# Patient Record
Sex: Female | Born: 1940
Health system: Southern US, Community
[De-identification: ages and names within clinical notes are randomized; demographics above are authoritative.]

## PROBLEM LIST (undated history)

## (undated) DIAGNOSIS — I10 Essential (primary) hypertension: Secondary | ICD-10-CM

## (undated) DIAGNOSIS — E785 Hyperlipidemia, unspecified: Secondary | ICD-10-CM

## (undated) DIAGNOSIS — F32A Depression, unspecified: Secondary | ICD-10-CM

## (undated) DIAGNOSIS — M8580 Other specified disorders of bone density and structure, unspecified site: Secondary | ICD-10-CM

## (undated) DIAGNOSIS — Z4509 Encounter for adjustment and management of other cardiac device: Secondary | ICD-10-CM

## (undated) DIAGNOSIS — D649 Anemia, unspecified: Secondary | ICD-10-CM

## (undated) DIAGNOSIS — R55 Syncope and collapse: Secondary | ICD-10-CM

## (undated) DIAGNOSIS — K219 Gastro-esophageal reflux disease without esophagitis: Secondary | ICD-10-CM

## (undated) HISTORY — DX: Anemia, unspecified: D64.9

## (undated) HISTORY — DX: Depression, unspecified: F32.A

## (undated) HISTORY — DX: Hyperlipidemia, unspecified: E78.5

## (undated) HISTORY — DX: Other specified disorders of bone density and structure, unspecified site: M85.80

## (undated) HISTORY — DX: Gastro-esophageal reflux disease without esophagitis: K21.9

## (undated) HISTORY — DX: Essential (primary) hypertension: I10

---
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
## 1998-05-07 HISTORY — PX: APPENDECTOMY: SHX54

## 2000-05-07 HISTORY — PX: CERVICAL LAMINOPLASTY: SHX1333

## 2011-05-08 HISTORY — PX: CHOLECYSTECTOMY: SHX55

## 2018-11-10 ENCOUNTER — Ambulatory Visit (INDEPENDENT_AMBULATORY_CARE_PROVIDER_SITE_OTHER): Payer: Medicare Other | Admitting: Internal Medicine

## 2018-11-10 ENCOUNTER — Encounter: Payer: Self-pay | Admitting: Internal Medicine

## 2018-11-10 ENCOUNTER — Other Ambulatory Visit: Payer: Self-pay

## 2018-11-10 VITALS — Wt 124.0 lb

## 2018-11-10 DIAGNOSIS — R509 Fever, unspecified: Secondary | ICD-10-CM

## 2018-11-10 NOTE — Progress Notes (Signed)
  RFV: intermittent fevers  Patient ID: Mary Leblanc, female   DOB: Jun 06, 1940, 78 y.o.   MRN: [id number]  HPI Khushboo is a 78yo F who reports flu like illness in April and also reports other household member being sick. At that time did not get tested for covid initially, but did stay home for presumed covid. She states that she did recall recovering however since then had intermittent fevers, malaise with occasional cough. She reports that she last had fever/cough in June 15th. She was referred to [facility] clinic for evaluation. She has FUO work up through [employer], but no significant answers found by PCP. She was referred to ID to see if any further work up, and whether if this recurrent infection.  Outpatient Encounter Medications as of 11/10/2018  Medication Sig  . alendronate (FOSAMAX) 70 MG tablet Take 70 mg by mouth once a week. Take with a full glass of water on an empty stomach.  Marland Kitchen aspirin EC 81 MG tablet Take 81 mg by mouth daily.  . B Complex-C (B-COMPLEX WITH VITAMIN C) tablet Take 1 tablet by mouth daily.  . Calcium Polycarbophil (FIBER-CAPS PO) Take by mouth.  . calcium-vitamin D (OSCAL WITH D) 500-200 MG-UNIT tablet Take 1 tablet by mouth.  . gabapentin (NEURONTIN) 300 MG capsule Take 300 mg by mouth 3 (three) times daily.  Marland Kitchen loperamide (IMODIUM) 2 MG capsule Take by mouth as needed for diarrhea or loose stools.  . simvastatin (ZOCOR) 10 MG tablet Take 10 mg by mouth daily.   No facility-administered encounter medications on file as of 11/10/2018.      There are no active problems to display for this patient.    Health Maintenance Due  Topic Date Due  . TETANUS/TDAP  07/23/1960  . DEXA SCAN  07/24/2006  . PNA vac Low Risk Adult (1 of 2 - PCV13) 07/24/2006     Review of Systems 12 point ros is negative currently, but other positives noted in hpi. Physical Exam   Wt 124 lb (56.2 kg) . Physical Exam  Constitutional:  oriented to person, place, and time. appears well-developed  and well-nourished. No distress.  HENT: [place]/AT, PERRLA, no scleral icterus Mouth/Throat: Oropharynx is clear and moist. No oropharyngeal exudate.  Cardiovascular: Normal rate, regular rhythm and normal heart sounds. Exam reveals no gallop and no friction rub.  No murmur heard.  Pulmonary/Chest: Effort normal and breath sounds normal. No respiratory distress.  has no wheezes.  Neck = supple, no nuchal rigidity Abdominal: Soft. Bowel sounds are normal.  exhibits no distension. There is no tenderness.  Lymphadenopathy: no cervical adenopathy. No axillary adenopathy Neurological: alert and oriented to person, place, and time.  Skin: Skin is warm and dry. No rash noted. No erythema.  Psychiatric: a normal mood and affect.  behavior is normal.   Notes /tests reviewed  Assessment and Plan  FUO = will ask her to collect fever curve. Consider getting covid 19 serology. Though literature is limited in terms of understanding reinfection of covid 42 or recrudescence which has somewhat been mentioned. She appears clinically stable currently. Will aks her to come back in a few weeks with her fever curve. No need for testing at this time  Spent 30 min with patient in counseling of fuo

## 2018-12-04 ENCOUNTER — Telehealth: Payer: Self-pay | Admitting: Internal Medicine

## 2018-12-04 NOTE — Telephone Encounter (Signed)
COVID-19 Pre-Screening Questions:12/04/18 ° ° °Do you currently have a fever (>100 °F), chills or unexplained body aches? NO  ° °Are you currently experiencing new cough, shortness of breath, sore throat, runny nose? NO °•  °Have you recently travelled outside the state of [place] in the last 14 days? NO °•  °Have you been in contact with someone that is currently pending confirmation of Covid19 testing or has been confirmed to have the Covid19 virus?  NO ° °**If the patient answers NO to ALL questions -  advise the patient to please call the clinic before coming to the office should any symptoms develop.  ° ° ° °

## 2018-12-08 ENCOUNTER — Other Ambulatory Visit: Payer: Self-pay

## 2018-12-08 ENCOUNTER — Ambulatory Visit (INDEPENDENT_AMBULATORY_CARE_PROVIDER_SITE_OTHER): Payer: Medicare Other | Admitting: Internal Medicine

## 2018-12-08 ENCOUNTER — Encounter: Payer: Self-pay | Admitting: Internal Medicine

## 2018-12-08 VITALS — BP 128/70 | HR 64 | Temp 99.0°F

## 2018-12-08 DIAGNOSIS — R509 Fever, unspecified: Secondary | ICD-10-CM

## 2018-12-08 NOTE — Progress Notes (Signed)
Patient ID: Mary Leblanc, female   DOB: [birth year], 78 y.o.   MRN: [id number]  HPI Mary Leblanc is a 78yo F briefly, she had covid like illness in mid late march recovered but then would have intermittent fevers in April, may, June wit associated fatigue.serology showed IgA + covid 19  In the last 4 wk no fevers  Has some dysphagia noted with pills and solid foods.  Pmhx: hx of thyroid nodule. Had colonoscopy at [facility] in [place] in 2019, next due in 2024  Family hx: thyroid cancer  Outpatient Encounter Medications as of 12/08/2018  Medication Sig  . alendronate (FOSAMAX) 70 MG tablet Take 70 mg by mouth once a week. Take with a full glass of water on an empty stomach.  Marland Kitchen. aspirin EC 81 MG tablet Take 81 mg by mouth daily.  . B Complex-C (B-COMPLEX WITH VITAMIN C) tablet Take 1 tablet by mouth daily.  . Calcium Polycarbophil (FIBER-CAPS PO) Take by mouth.  . calcium-vitamin D (OSCAL WITH D) 500-200 MG-UNIT tablet Take 1 tablet by mouth.  . gabapentin (NEURONTIN) 300 MG capsule Take 300 mg by mouth 3 (three) times daily.  Marland Kitchen. loperamide (IMODIUM) 2 MG capsule Take by mouth as needed for diarrhea or loose stools.  . simvastatin (ZOCOR) 10 MG tablet Take 10 mg by mouth daily.   No facility-administered encounter medications on file as of 12/08/2018.      There are no active problems to display for this patient.    Health Maintenance Due  Topic Date Due  . TETANUS/TDAP  07/23/1960  . DEXA SCAN  07/24/2006  . PNA vac Low Risk Adult (1 of 2 - PCV13) 07/24/2006  . INFLUENZA VACCINE  12/06/2018     Review of Systems Review of Systems  Constitutional: Negative for fever, chills, diaphoresis, activity change, appetite change, fatigue and unexpected weight change.  HENT: Negative for congestion, sore throat, rhinorrhea, sneezing, trouble swallowing and sinus pressure.  Eyes: Negative for photophobia and visual disturbance.  Respiratory: Negative for cough, chest tightness, shortness  of breath, wheezing and stridor.  Cardiovascular: Negative for chest pain, palpitations and leg swelling.  Gastrointestinal: +dysphagia. Negative for nausea, vomiting, abdominal pain, diarrhea, constipation, blood in stool, abdominal distention and anal bleeding.  Genitourinary: Negative for dysuria, hematuria, flank pain and difficulty urinating.  Musculoskeletal: Negative for myalgias, back pain, joint swelling, arthralgias and gait problem.  Skin: Negative for color change, pallor, rash and wound.  Neurological: Negative for dizziness, tremors, weakness and light-headedness.  Hematological: Negative for adenopathy. Does not bruise/bleed easily.  Psychiatric/Behavioral: Negative for behavioral problems, confusion, sleep disturbance, dysphoric mood, decreased concentration and agitation.    Physical Exam   BP 128/70   Pulse 64   Temp 99 F (37.2 C) (Oral)    Physical Exam  Constitutional:  oriented to person, place, and time. appears well-developed and well-nourished. No distress.  HENT: [place]/AT, PERRLA, no scleral icterus Mouth/Throat: Oropharynx is clear and moist. No oropharyngeal exudate. No thyromegaly Pulmonary/Chest: Effort normal and breath sounds normal. No respiratory distress.  has no wheezes.  Neck = supple, no nuchal rigidity Neurological: alert and oriented to person, place, and time.  Skin: Skin is warm and dry. No rash noted. No erythema.  Psychiatric: a normal mood and affect.  behavior is normal.     Assessment and Plan Recommend follow up for dysphagia, possibly start with barium swallow then decide if need to see gi.   Thyroid exam no palpable lesions.  Will cc dr  tysovic

## 2019-01-13 ENCOUNTER — Other Ambulatory Visit: Payer: Self-pay | Admitting: Internal Medicine

## 2019-01-13 DIAGNOSIS — Z1231 Encounter for screening mammogram for malignant neoplasm of breast: Secondary | ICD-10-CM

## 2019-01-22 ENCOUNTER — Encounter: Payer: Self-pay | Admitting: Cardiology

## 2019-01-22 ENCOUNTER — Ambulatory Visit (INDEPENDENT_AMBULATORY_CARE_PROVIDER_SITE_OTHER): Payer: Medicare Other | Admitting: Cardiology

## 2019-01-22 ENCOUNTER — Other Ambulatory Visit: Payer: Self-pay

## 2019-01-22 VITALS — BP 147/66 | HR 67 | Temp 97.8°F | Ht 60.0 in | Wt 124.0 lb

## 2019-01-22 DIAGNOSIS — R55 Syncope and collapse: Secondary | ICD-10-CM | POA: Insufficient documentation

## 2019-01-22 DIAGNOSIS — Z95818 Presence of other cardiac implants and grafts: Secondary | ICD-10-CM | POA: Diagnosis not present

## 2019-01-22 DIAGNOSIS — R079 Chest pain, unspecified: Secondary | ICD-10-CM

## 2019-01-22 HISTORY — DX: Syncope and collapse: R55

## 2019-01-22 NOTE — Progress Notes (Signed)
Patient referred by Gaspar Garbeisovec, Richard W, MD for syncope  Subjective:   Mary Leblanc, female    DOB: Dec 02, 1940, 78 y.o.   MRN: [id number]   Chief Complaint  Patient presents with  . Loss of Consciousness  . New Patient (Initial Visit)    HPI  78 y.o. Caucasian female with syncope   Patient is active and walks 6 miles a day 5 days a week without difficulty.  However, on 01/07/2019 at around 1:30 PM, patient had an episode of midsternal nonradiating chest pressure accompanied with shortness of breath while walking up the hill, along with sensation of "going out".  She lost consciousness and woke up to go passerby who stopped to help her.  EMS were called.  Her blood pressure was 60s over 30s, improved with IV fluids.  EKG in blood sugar were checked by EMS.  I personally reviewed the EKG performed by EMS, which showed sinus rhythm and prolonged QT interval at 499 ms.  Of note, this EKG was performed after patient regained consciousness and was no longer having chest pressure.  There were no ST segment changes on this EKG.  Patient was offered transfer to ED, but patient felt better and did not go to the ED. Since the episode, patient is back to walking up to 6 miles, although on flat ground without hills.   Patient has had several syncopal episodes in the past, usually at rest, associated with nausea, diaphoresis.  Her baseline EKG has had prolonged QT interval at times.  She has history of sudden death in her paternal grandfather at age 77.  Probably as a result of this, she underwent loop recorder [facility] [place] while she was still in [place].  I personally reviewed the loop recorder monitoring today, which does not show any arrhythmias.    Patient is a retired Animal nutritionistN and budget analyst.  She has also worked in [place] status [place].  She lives with her husband in [place] for several years.  [place] in January 2020.  Past Medical History:   Diagnosis Date  . Hyperlipidemia      Past Surgical History:  Procedure Laterality Date  . APPENDECTOMY  2000  . CHOLECYSTECTOMY  2013  . NECK SURGERY  2002     Social History   Socioeconomic History  . Marital status: Married    Spouse name: Not on file  . Number of children: Not on file  . Years of education: Not on file  . Highest education level: Not on file  Occupational History  . Not on file  Social Needs  . Financial resource strain: Not on file  . Food insecurity    Worry: Not on file    Inability: Not on file  . Transportation needs    Medical: Not on file    Non-medical: Not on file  Tobacco Use  . Smoking status: Never Smoker  . Smokeless tobacco: Never Used  Substance and Sexual Activity  . Alcohol use: Yes    Comment: occ  . Drug use: Not Currently  . Sexual activity: Not on file  Lifestyle  . Physical activity    Days per week: Not on file    Minutes per session: Not on file  . Stress: Not on file  Relationships  . Social Musicianconnections    Talks on phone: Not on file    Gets together: Not on file    Attends religious service:  Not on file    Active member of club or organization: Not on file    Attends meetings of clubs or organizations: Not on file    Relationship status: Not on file  . Intimate partner violence    Fear of current or ex partner: Not on file    Emotionally abused: Not on file    Physically abused: Not on file    Forced sexual activity: Not on file  Other Topics Concern  . Not on file  Social History Narrative  . Not on file     Family History  Problem Relation Age of Onset  . Hypertension Mother   . Stroke Mother   . Heart attack Father   . Diabetes Brother      Current Outpatient Medications on File Prior to Visit  Medication Sig Dispense Refill  . alendronate (FOSAMAX) 70 MG tablet Take 70 mg by mouth once a week. Take with a full glass of water on an empty stomach.    Marland Kitchen aspirin EC 81 MG tablet Take 81 mg by  mouth daily.    . B Complex-C (B-COMPLEX WITH VITAMIN C) tablet Take 1 tablet by mouth daily.    . Calcium Polycarbophil (FIBER-CAPS PO) Take by mouth.    . calcium-vitamin D (OSCAL WITH D) 500-200 MG-UNIT tablet Take 1 tablet by mouth.    . gabapentin (NEURONTIN) 300 MG capsule Take 300 mg by mouth 3 (three) times daily.    Marland Kitchen loperamide (IMODIUM) 2 MG capsule Take by mouth as needed for diarrhea or loose stools.    . simvastatin (ZOCOR) 10 MG tablet Take 10 mg by mouth daily.     No current facility-administered medications on file prior to visit.     Cardiovascular studies:  EKG 01/22/2019: Sinus rhythm 63 bpm. QTc 463 msec Normal EKG.  EKG 01/13/2019: Sinus rhythm 50 bpm.  Normal EKG.  Recent labs: Not available   Review of Systems  Constitution: Negative for decreased appetite, malaise/fatigue, weight gain and weight loss.  HENT: Negative for congestion.   Eyes: Negative for visual disturbance.  Cardiovascular: Positive for chest pain, dyspnea on exertion and syncope. Negative for leg swelling and palpitations.  Respiratory: Positive for shortness of breath. Negative for cough.   Endocrine: Negative for cold intolerance.  Hematologic/Lymphatic: Does not bruise/bleed easily.  Skin: Negative for itching and rash.  Musculoskeletal: Negative for myalgias.  Gastrointestinal: Negative for abdominal pain, nausea and vomiting.  Genitourinary: Negative for dysuria.  Neurological: Negative for dizziness and weakness.  Psychiatric/Behavioral: The patient is not nervous/anxious.   All other systems reviewed and are negative.       Vitals:   01/22/19 1452  BP: (!) 147/66  Pulse: 67  Temp: 97.8 F (36.6 C)  SpO2: 99%     Body mass index is 24.22 kg/m. Filed Weights   01/22/19 1452  Weight: 124 lb (56.2 kg)     Objective:   Physical Exam  Constitutional: She is oriented to person, place, and time. She appears well-developed and well-nourished. No distress.  HENT:   Head: Normocephalic and atraumatic.  Eyes: Pupils are equal, round, and reactive to light. Conjunctivae are normal.  Neck: No JVD present.  Cardiovascular: Normal rate, regular rhythm and intact distal pulses.  No murmur heard. Pulmonary/Chest: Effort normal and breath sounds normal. She has no wheezes. She has no rales.  Abdominal: Soft. Bowel sounds are normal. There is no rebound.  Musculoskeletal:        General: No  edema.  Lymphadenopathy:    She has no cervical adenopathy.  Neurological: She is alert and oriented to person, place, and time. No cranial nerve deficit.  Skin: Skin is warm and dry.  Psychiatric: She has a normal mood and affect.  Nursing note and vitals reviewed.         Assessment & Recommendations:   78 y.o. Caucasian female with exertional chest pressure and syncope, family history of CAD.    Exertional chest pressure and syncope: While many of her syncopal episodes do appear vasovagal in etiology, most recent episode on 01/07/2019 occurred while climbing up the hill, and associated with exertional chest pressure and dyspnea.  Will obtain exercise nuclear stress test to evaluate for obstructive CAD.  Will obtain reports of echocardiogram performed at [facility] in 2019.  Continue aspirin and simvastatin at this time.  Personally reviewed remote monitoring check of [employer] loop recorder.  No arrhythmias noted.  Elevated blood pressure without diagnosis of hypertension: Blood pressure usually controlled.  Elevated today.  Recommend regular home monitoring and repeat office check.   Thank you for referring the patient to [place]. Please feel free to contact with any questions.  Nigel Mormon, MD [facility] Pager: [phone] Office: [phone] If no answer Cell [phone]

## 2019-02-11 DIAGNOSIS — Z95818 Presence of other cardiac implants and grafts: Secondary | ICD-10-CM | POA: Diagnosis not present

## 2019-02-11 DIAGNOSIS — R55 Syncope and collapse: Secondary | ICD-10-CM | POA: Diagnosis not present

## 2019-02-11 DIAGNOSIS — Z4509 Encounter for adjustment and management of other cardiac device: Secondary | ICD-10-CM | POA: Diagnosis not present

## 2019-02-14 ENCOUNTER — Encounter: Payer: Self-pay | Admitting: Cardiology

## 2019-02-14 DIAGNOSIS — Z4509 Encounter for adjustment and management of other cardiac device: Secondary | ICD-10-CM

## 2019-02-14 DIAGNOSIS — Z9889 Other specified postprocedural states: Secondary | ICD-10-CM | POA: Insufficient documentation

## 2019-02-14 HISTORY — DX: Encounter for adjustment and management of other cardiac device: Z45.09

## 2019-02-16 ENCOUNTER — Other Ambulatory Visit: Payer: Medicare Other

## 2019-02-23 ENCOUNTER — Telehealth: Payer: Self-pay

## 2019-02-23 NOTE — Telephone Encounter (Signed)
-----   Message from Adrian Prows, MD sent at 02/15/2019  5:15 PM EDT ----- Regarding: Loop recorder No A. Fib no heart block or slow HR. Continue remote monitoring

## 2019-02-23 NOTE — Telephone Encounter (Signed)
Left vm with details

## 2019-02-25 ENCOUNTER — Ambulatory Visit: Payer: Medicare Other | Admitting: Cardiology

## 2019-03-09 ENCOUNTER — Other Ambulatory Visit: Payer: Self-pay

## 2019-03-09 ENCOUNTER — Ambulatory Visit (INDEPENDENT_AMBULATORY_CARE_PROVIDER_SITE_OTHER): Payer: Medicare Other

## 2019-03-09 DIAGNOSIS — R55 Syncope and collapse: Secondary | ICD-10-CM

## 2019-03-09 DIAGNOSIS — R079 Chest pain, unspecified: Secondary | ICD-10-CM

## 2019-03-11 NOTE — Progress Notes (Signed)
Gave results to patient. Patient verbalized understanding.

## 2019-03-11 NOTE — Progress Notes (Signed)
Called pt no answer, left a vm

## 2019-03-17 ENCOUNTER — Other Ambulatory Visit: Payer: Self-pay | Admitting: Internal Medicine

## 2019-03-17 DIAGNOSIS — E041 Nontoxic single thyroid nodule: Secondary | ICD-10-CM

## 2019-03-18 ENCOUNTER — Ambulatory Visit: Payer: Medicare Other | Admitting: Cardiology

## 2019-03-23 ENCOUNTER — Ambulatory Visit
Admission: RE | Admit: 2019-03-23 | Discharge: 2019-03-23 | Disposition: A | Discharge status: Home / Self Care | Payer: Medicare Other | Source: Ambulatory Visit | Attending: Internal Medicine | Admitting: Internal Medicine

## 2019-03-23 DIAGNOSIS — E041 Nontoxic single thyroid nodule: Secondary | ICD-10-CM

## 2019-03-24 ENCOUNTER — Telehealth: Payer: Self-pay

## 2019-03-24 NOTE — Telephone Encounter (Signed)
Pt aware.

## 2019-03-24 NOTE — Telephone Encounter (Signed)
-----   Message from Adrian Prows, MD sent at 03/19/2019 11:16 PM EST ----- Regarding: Loop Scheduled Remote loop recorder check 03/13/2019:  Normal sinus rhythm.  No significant abnormalities. No heart block.  Continue remote monitoring.

## 2019-03-25 ENCOUNTER — Telehealth (INDEPENDENT_AMBULATORY_CARE_PROVIDER_SITE_OTHER): Payer: Medicare Other | Admitting: Cardiology

## 2019-03-25 ENCOUNTER — Other Ambulatory Visit: Payer: Self-pay

## 2019-03-25 VITALS — BP 136/73 | HR 71

## 2019-03-25 DIAGNOSIS — R55 Syncope and collapse: Secondary | ICD-10-CM

## 2019-03-25 DIAGNOSIS — R079 Chest pain, unspecified: Secondary | ICD-10-CM | POA: Diagnosis not present

## 2019-03-25 DIAGNOSIS — E782 Mixed hyperlipidemia: Secondary | ICD-10-CM

## 2019-03-25 NOTE — Progress Notes (Addendum)
Patient referred by Haywood Pao, MD for syncope  Subjective:   Mary Leblanc, female    DOB: 06-06-1940, 78 y.o.   MRN: [id number]   Chief Complaint  Patient presents with  . Loss of Consciousness    HPI  78 y.o. Caucasian female with exertional chest pressure and syncope, family history of CAD.    Patient has not had any episode of syncope since 01/2019. She is back to walking 6 miles, without any denies chest pain, shortness of breath, palpitations, leg edema, orthopnea, PND, TIA/syncope. She is wearing a loop recorder, which has not showed any arrhthymias to correlate with her syncope episodes. She recalls that she had had similar episodes of syncope in her younger yeats.   Stress test results discussed with the patient, details below.   Initial consult note HPI 01/22/2019: Patient is active and walks 6 miles a day 5 days a week without difficulty.  However, on 01/07/2019 at around 1:30 PM, patient had an episode of midsternal nonradiating chest pressure accompanied with shortness of breath while walking up the hill, along with sensation of "going out".  She lost consciousness and woke up to go passerby who stopped to help her.  EMS were called.  Her blood pressure was 60s over 30s, improved with IV fluids.  EKG in blood sugar were checked by EMS.  I personally reviewed the EKG performed by EMS, which showed sinus rhythm and prolonged QT interval at 499 ms.  Of note, this EKG was performed after patient regained consciousness and was no longer having chest pressure.  There were no ST segment changes on this EKG.  Patient was offered transfer to ED, but patient felt better and did not go to the ED. Since the episode, patient is back to walking up to 6 miles, although on flat ground without hills.   Patient has had several syncopal episodes in the past, usually at rest, associated with nausea, diaphoresis.  Her baseline EKG has had prolonged QT interval at times.  She has history of  sudden death in her paternal grandfather at age 38.  Probably as a result of this, she underwent loop recorder placement at [facility] clinic in [place] while she was still in [place].  I personally reviewed the loop recorder monitoring today, which does not show any arrhythmias.    Patient is a retired Dealer.  She has also worked in [place] status family.  She lives with her husband in [place] for several years.  They moved from [place] to [place] in January 2020.  Past Medical History:  Diagnosis Date  . Encounter for loop recorder check 02/14/2019  . Hyperlipidemia   . Syncope and collapse 01/22/2019     Past Surgical History:  Procedure Laterality Date  . APPENDECTOMY  2000  . CHOLECYSTECTOMY  2013  . NECK SURGERY  2002     Social History   Socioeconomic History  . Marital status: Married    Spouse name: Not on file  . Number of children: Not on file  . Years of education: Not on file  . Highest education level: Not on file  Occupational History  . Not on file  Social Needs  . Financial resource strain: Not on file  . Food insecurity    Worry: Not on file    Inability: Not on file  . Transportation needs    Medical: Not on file    Non-medical: Not on file  Tobacco Use  .  Smoking status: Former Smoker    Packs/day: 1.00    Years: 10.00    Pack years: 10.00    Types: Cigarettes    Quit date: 1968    Years since quitting: 52.9  . Smokeless tobacco: Never Used  Substance and Sexual Activity  . Alcohol use: Yes    Comment: occ  . Drug use: Not Currently  . Sexual activity: Not on file  Lifestyle  . Physical activity    Days per week: Not on file    Minutes per session: Not on file  . Stress: Not on file  Relationships  . Social Musician on phone: Not on file    Gets together: Not on file    Attends religious service: Not on file    Active member of club or organization: Not on file    Attends meetings of clubs or  organizations: Not on file    Relationship status: Not on file  . Intimate partner violence    Fear of current or ex partner: Not on file    Emotionally abused: Not on file    Physically abused: Not on file    Forced sexual activity: Not on file  Other Topics Concern  . Not on file  Social History Narrative  . Not on file     Family History  Problem Relation Age of Onset  . Hypertension Mother   . Stroke Mother   . Heart attack Father   . Diabetes Brother      Current Outpatient Medications on File Prior to Visit  Medication Sig Dispense Refill  . alendronate (FOSAMAX) 70 MG tablet Take 70 mg by mouth once a week. Take with a full glass of water on an empty stomach.    Marland Kitchen aspirin EC 81 MG tablet Take 81 mg by mouth daily.    . B Complex-C (B-COMPLEX WITH VITAMIN C) tablet Take 1 tablet by mouth daily.    . Calcium Polycarbophil (FIBER-CAPS PO) Take by mouth.    . calcium-vitamin D (OSCAL WITH D) 500-200 MG-UNIT tablet Take 1 tablet by mouth.    . gabapentin (NEURONTIN) 300 MG capsule Take 600 mg by mouth 3 (three) times daily.     Marland Kitchen loperamide (IMODIUM) 2 MG capsule Take by mouth as needed for diarrhea or loose stools.    . simvastatin (ZOCOR) 10 MG tablet Take 10 mg by mouth daily.     No current facility-administered medications on file prior to visit.     Cardiovascular studies:  Exercise tetrofosmin stress test  03/09/2019: Normal ECG stress. exercised for a total of 6 minutes and 0 seconds, achieving approximately 7.05 METs. Baseline heart rate was measured at 62 bpm. A maximum heart rate of 128 beats per minute was achieved, which is 90% of maximum predicted heart rate.  During exercise the patient developed  dyspnea, dizziness, & chest tightness 5/10. Exercise was terminated due to target heart rate achieved.  The calculated Duke Treadmill Score is 2.00 (Moderate risk) Normal myocardial perfusion. Stress LV EF: 62%. Low risk study.  No previous exam available for  comparison.  EKG 01/22/2019: Sinus rhythm 63 bpm. QTc 463 msec Normal EKG.  EKG 01/13/2019: Sinus rhythm 50 bpm.  Normal EKG.  Recent labs: Not available   Review of Systems  Constitution: Negative for decreased appetite, malaise/fatigue, weight gain and weight loss.  HENT: Negative for congestion.   Eyes: Negative for visual disturbance.  Cardiovascular: Positive for chest pain, dyspnea on  exertion and syncope. Negative for leg swelling and palpitations.  Respiratory: Positive for shortness of breath. Negative for cough.   Endocrine: Negative for cold intolerance.  Hematologic/Lymphatic: Does not bruise/bleed easily.  Skin: Negative for itching and rash.  Musculoskeletal: Negative for myalgias.  Gastrointestinal: Negative for abdominal pain, nausea and vomiting.  Genitourinary: Negative for dysuria.  Neurological: Negative for dizziness and weakness.  Psychiatric/Behavioral: The patient is not nervous/anxious.   All other systems reviewed and are negative.       Vitals:   03/25/19 1453  BP: 136/73  Pulse: 71     Objective:   Physical Exam  Constitutional: She is oriented to person, place, and time. She appears well-developed and well-nourished. No distress.  HENT:  Head: Normocephalic and atraumatic.  Eyes: Pupils are equal, round, and reactive to light. Conjunctivae are normal.  Neck: No JVD present.  Cardiovascular: Normal rate, regular rhythm and intact distal pulses.  No murmur heard. Pulmonary/Chest: Effort normal and breath sounds normal. She has no wheezes. She has no rales.  Abdominal: Soft. Bowel sounds are normal. There is no rebound.  Musculoskeletal:        General: No edema.  Lymphadenopathy:    She has no cervical adenopathy.  Neurological: She is alert and oriented to person, place, and time. No cranial nerve deficit.  Skin: Skin is warm and dry.  Psychiatric: She has a normal mood and affect.  Nursing note and vitals reviewed.          Assessment & Recommendations:   78 y.o. Caucasian female with exertional chest pressure and syncope, family history of CAD.    Exertional chest pressure and syncope: No ischemia on stress test or EKG, although she did have chest pressure during exercise. No recurrence of symptoms. No arrhythmia on loop recorder. I did [facility] see [facility] [place]. Nonetheless, suspicion for structural abnormality is low. EF normal on stress test. Will get recent labs from PCP, including lipid panel.   With the likelihood that her syncope episode was vasovagal, recommend liberal hydration, use of compression stockings, and counter pressure maneuvers. In case of recurrent syncope, may need to consider midodrine.    I will see her on as needed basis.   Elder NegusManish J Senia Even, MD [facility] Pager: [phone] Office: [phone] If no answer Cell [phone]   Addendum: Received these labs after the visi. Cholesterol 202, triglycerides 236, HDL 57, LDL 98. Apolipoprotein B 107 (<90)  Recommend switching simvastatin 10 mg to rosuvastatin 10 mg. Repeat lipid panel in 6  Months, then OV

## 2019-03-26 ENCOUNTER — Encounter: Payer: Self-pay | Admitting: Cardiology

## 2019-03-26 MED ORDER — ROSUVASTATIN CALCIUM 10 MG PO TABS: 10.0000 mg | ORAL_TABLET | Freq: Every day | ORAL | 5 refills | Status: DC

## 2019-03-26 NOTE — Addendum Note (Signed)
Addended by: Nigel Mormon on: 03/26/2019 01:23 PM   Modules accepted: Orders

## 2019-04-14 DIAGNOSIS — Z95818 Presence of other cardiac implants and grafts: Secondary | ICD-10-CM | POA: Diagnosis not present

## 2019-04-14 DIAGNOSIS — Z4509 Encounter for adjustment and management of other cardiac device: Secondary | ICD-10-CM | POA: Diagnosis not present

## 2019-04-14 DIAGNOSIS — R55 Syncope and collapse: Secondary | ICD-10-CM | POA: Diagnosis not present

## 2019-04-15 ENCOUNTER — Ambulatory Visit (INDEPENDENT_AMBULATORY_CARE_PROVIDER_SITE_OTHER): Payer: Medicare Other | Admitting: Podiatry

## 2019-04-15 ENCOUNTER — Other Ambulatory Visit: Payer: Self-pay

## 2019-04-15 ENCOUNTER — Ambulatory Visit (INDEPENDENT_AMBULATORY_CARE_PROVIDER_SITE_OTHER): Payer: Medicare Other

## 2019-04-15 ENCOUNTER — Encounter: Payer: Self-pay | Admitting: Podiatry

## 2019-04-15 VITALS — BP 150/82

## 2019-04-15 DIAGNOSIS — M79671 Pain in right foot: Secondary | ICD-10-CM

## 2019-04-15 DIAGNOSIS — M7671 Peroneal tendinitis, right leg: Secondary | ICD-10-CM | POA: Diagnosis not present

## 2019-04-15 DIAGNOSIS — M722 Plantar fascial fibromatosis: Secondary | ICD-10-CM

## 2019-04-15 MED ORDER — METHYLPREDNISOLONE 4 MG PO TBPK: ORAL_TABLET | ORAL | 0 refills | Status: DC

## 2019-04-15 NOTE — Patient Instructions (Signed)

## 2019-04-16 ENCOUNTER — Encounter: Payer: Self-pay | Admitting: Podiatry

## 2019-04-16 NOTE — Progress Notes (Signed)
Subjective:  Patient ID: [place], female    DOB: Mar 14, 1941,  MRN: [id number]  Chief Complaint  Patient presents with  . Foot Pain    pt is here for plantar fasciitis of the right foot    78 y.o. female presents with the above complaint.  Patient presents with acute pain on the bottom of her right heel as well as some on the lateral side of her heel.  Patient states been going on for couple of months.  She states is burning sensation to the bottom of the right heel.  The pain is on and off.  Patient states that it is sometimes worse in the morning and get slightly better as she walks throughout the day but generally stays the same.  She denies any other acute complaints.  She also has secondary pain on the lateral aspect of her right foot as well.  She denies any aggravating factor except for walking.  She denies any treatment modalities.  She denies seeing any other podiatrist.  She denies any other acute complaints.   Review of Systems: Negative except as noted in the HPI. Denies N/V/F/Ch.  Past Medical History:  Diagnosis Date  . Encounter for loop recorder check 02/14/2019  . Hyperlipidemia   . Syncope and collapse 01/22/2019    Current Outpatient Medications:  .  alendronate (FOSAMAX) 70 MG tablet, Take 70 mg by mouth once a week. Take with a full glass of water on an empty stomach., Disp: , Rfl:  .  amoxicillin (AMOXIL) 500 MG capsule, TAKE 2 CAPSULES BY MOUTH NOW THEN 1 CAPSULE 4 TIMES A DAY FOR DENTAL INFECTION, Disp: , Rfl:  .  aspirin EC 81 MG tablet, Take 81 mg by mouth daily., Disp: , Rfl:  .  B Complex-C (B-COMPLEX WITH VITAMIN C) tablet, Take 1 tablet by mouth daily., Disp: , Rfl:  .  Calcium Polycarbophil (FIBER-CAPS PO), Take by mouth., Disp: , Rfl:  .  calcium-vitamin D (OSCAL WITH D) 500-200 MG-UNIT tablet, Take 1 tablet by mouth., Disp: , Rfl:  .  chlorhexidine (PERIDEX) 0.12 % solution, SMARTSIG:0.5 Ounce(s) By Mouth Twice Daily, Disp: , Rfl:  .  gabapentin  (NEURONTIN) 300 MG capsule, Take 600 mg by mouth 3 (three) times daily. , Disp: , Rfl:  .  ibuprofen (ADVIL) 800 MG tablet, Take 800 mg by mouth every 8 (eight) hours., Disp: , Rfl:  .  loperamide (IMODIUM) 2 MG capsule, Take by mouth as needed for diarrhea or loose stools., Disp: , Rfl:  .  predniSONE (STERAPRED UNI-PAK 21 TAB) 10 MG (21) TBPK tablet, TAKE 6 TABLETS ON DAY 1 AS DIRECTED ON PACKAGE AND DECREASE BY 1 TAB EACH DAY FOR A TOTAL OF 6 DAYS, Disp: , Rfl:  .  rosuvastatin (CRESTOR) 10 MG tablet, Take 1 tablet (10 mg total) by mouth daily., Disp: 30 tablet, Rfl: 5 .  methylPREDNISolone (MEDROL DOSEPAK) 4 MG TBPK tablet, Use as directed, Disp: 1 each, Rfl: 0  Social History   Tobacco Use  Smoking Status Former Smoker  . Packs/day: 1.00  . Years: 10.00  . Pack years: 10.00  . Types: Cigarettes  . Quit date: 51  . Years since quitting: 52.9  Smokeless Tobacco Never Used    No Known Allergies Objective:   Vitals:   04/15/19 0848  BP: (!) 150/82   There is no height or weight on file to calculate BMI. Constitutional Well developed. Well nourished.  Vascular Dorsalis pedis pulses palpable bilaterally. Posterior tibial  pulses palpable bilaterally. Capillary refill normal to all digits.  No cyanosis or clubbing noted. Pedal hair growth normal.  Neurologic Normal speech. Oriented to person, place, and time. Epicritic sensation to light touch grossly present bilaterally.  Dermatologic Nails well groomed and normal in appearance. No open wounds. No skin lesions.  Orthopedic: Normal joint ROM without pain or crepitus bilaterally. No visible deformities. Tender to palpation at the calcaneal tuber right. No pain with calcaneal squeeze right. Ankle ROM full range of motion right. Silfverskiold Test: negative right.   Radiographs: Taken and reviewed. No acute fractures or dislocations. No evidence of stress fracture.  Plantar heel spur present. Posterior heel spur absent.    Assessment:   1. Plantar fasciitis of right foot   2. Pain in right foot   3. Peroneal tendinitis of right lower leg    Plan:  Patient was evaluated and treated and all questions answered.  Plantar Fasciitis, right - XR reviewed as above.  - Educated on icing and stretching. Instructions given.  - Injection delivered to the plantar fascia as below. - DME: Plantar Fascial Brace - Pharmacologic management: Medrol Dose Pak. Educated on risks/benefits and proper taking of medication.  Right peroneal tendinitis -At this time I will hold off on treatment treating the peroneal tendinitis.  I believe this is due to compensation from the heel pain as patient is firing the peroneal tendinitis because she is walking on the outside of her foot.  Given the changes in the gait mechanics, once I treat the plantar fasciitis and if her pain is still present at the peroneal tendons I would treat it but I am confident that once the heel pain is treated the peroneal tendinitis will also resolve.  Procedure: Injection Tendon/Ligament Location: Left plantar fascia at the glabrous junction; medial approach. Skin Prep: alcohol Injectate: 0.5 cc 0.5% marcaine plain, 0.5 cc of 1% Lidocaine, 0.5 cc kenalog 10. Disposition: Patient tolerated procedure well. Injection site dressed with a band-aid.  No follow-ups on file.

## 2019-04-27 ENCOUNTER — Telehealth: Payer: Self-pay

## 2019-04-27 NOTE — Telephone Encounter (Signed)
-----   Message from Adrian Prows, MD sent at 04/26/2019  7:44 PM EST ----- Regarding: Loop No abnormality in rhythm. Continue to monitor

## 2019-04-27 NOTE — Telephone Encounter (Signed)
LVM with details.

## 2019-05-15 DIAGNOSIS — Z95818 Presence of other cardiac implants and grafts: Secondary | ICD-10-CM

## 2019-05-15 DIAGNOSIS — R55 Syncope and collapse: Secondary | ICD-10-CM

## 2019-05-15 DIAGNOSIS — Z4509 Encounter for adjustment and management of other cardiac device: Secondary | ICD-10-CM

## 2019-05-20 ENCOUNTER — Telehealth: Payer: Self-pay

## 2019-05-20 NOTE — Telephone Encounter (Signed)
-----   Message from Yates Decamp, MD sent at 05/16/2019  9:53 PM EST ----- Regarding: Loop recorder Scheduled Remote loop recorder check 05/13/2019:  Normal sinus rhythm.  No significant abnormalities. No heart block.  Continue remote monitoring.  Normal rhythm, no abnormality.  JG

## 2019-05-20 NOTE — Telephone Encounter (Signed)
LVM with details.

## 2019-05-25 ENCOUNTER — Other Ambulatory Visit: Payer: Self-pay

## 2019-05-25 ENCOUNTER — Encounter: Payer: Self-pay | Admitting: Podiatry

## 2019-05-25 ENCOUNTER — Ambulatory Visit (INDEPENDENT_AMBULATORY_CARE_PROVIDER_SITE_OTHER): Payer: Medicare Other | Admitting: Podiatry

## 2019-05-25 DIAGNOSIS — M7731 Calcaneal spur, right foot: Secondary | ICD-10-CM | POA: Diagnosis not present

## 2019-05-25 DIAGNOSIS — M79671 Pain in right foot: Secondary | ICD-10-CM

## 2019-05-25 DIAGNOSIS — M722 Plantar fascial fibromatosis: Secondary | ICD-10-CM

## 2019-05-25 NOTE — Progress Notes (Signed)
Subjective:  Patient ID: Mary Leblanc, female    DOB: Oct 10, 1940,  MRN: [id number]  Chief Complaint  Patient presents with  . Plantar Fasciitis    right foot is no change since last office visit    79 y.o. female presents with the above complaint.  Patient is here for follow-up of right plantar fasciitis.  Patient states that there has not been much of a change since last visit.  She states the injection helped for a little bit but the pain was still been the same.  She states that she her goal is to walk for very long time.  She states that that has not been achieved yet.  She completed a Medrol Dosepak which helped for a little bit.  She has been wearing her plantar fascial brace intermittently and not at all times when she is ambulating.  She denies any other acute complaints.  She normally ambulates with new balance sneakers.   Review of Systems: Negative except as noted in the HPI. Denies N/V/F/Ch.  Past Medical History:  Diagnosis Date  . Encounter for loop recorder check 02/14/2019  . Hyperlipidemia   . Syncope and collapse 01/22/2019    Current Outpatient Medications:  .  alendronate (FOSAMAX) 70 MG tablet, Take 70 mg by mouth once a week. Take with a full glass of water on an empty stomach., Disp: , Rfl:  .  amoxicillin (AMOXIL) 500 MG capsule, TAKE 2 CAPSULES BY MOUTH NOW THEN 1 CAPSULE 4 TIMES A DAY FOR DENTAL INFECTION, Disp: , Rfl:  .  aspirin EC 81 MG tablet, Take 81 mg by mouth daily., Disp: , Rfl:  .  B Complex-C (B-COMPLEX WITH VITAMIN C) tablet, Take 1 tablet by mouth daily., Disp: , Rfl:  .  Calcium Polycarbophil (FIBER-CAPS PO), Take by mouth., Disp: , Rfl:  .  calcium-vitamin D (OSCAL WITH D) 500-200 MG-UNIT tablet, Take 1 tablet by mouth., Disp: , Rfl:  .  chlorhexidine (PERIDEX) 0.12 % solution, SMARTSIG:0.5 Ounce(s) By Mouth Twice Daily, Disp: , Rfl:  .  gabapentin (NEURONTIN) 300 MG capsule, Take 600 mg by mouth 3 (three) times daily. , Disp: , Rfl:  .  ibuprofen  (ADVIL) 800 MG tablet, Take 800 mg by mouth every 8 (eight) hours., Disp: , Rfl:  .  loperamide (IMODIUM) 2 MG capsule, Take by mouth as needed for diarrhea or loose stools., Disp: , Rfl:  .  methylPREDNISolone (MEDROL DOSEPAK) 4 MG TBPK tablet, Use as directed, Disp: 1 each, Rfl: 0 .  predniSONE (STERAPRED UNI-PAK 21 TAB) 10 MG (21) TBPK tablet, TAKE 6 TABLETS ON DAY 1 AS DIRECTED ON PACKAGE AND DECREASE BY 1 TAB EACH DAY FOR A TOTAL OF 6 DAYS, Disp: , Rfl:  .  rosuvastatin (CRESTOR) 10 MG tablet, Take 1 tablet (10 mg total) by mouth daily., Disp: 30 tablet, Rfl: 5  Social History   Tobacco Use  Smoking Status Former Smoker  . Packs/day: 1.00  . Years: 10.00  . Pack years: 10.00  . Types: Cigarettes  . Quit date: 2  . Years since quitting: 53.0  Smokeless Tobacco Never Used    No Known Allergies Objective:   There were no vitals filed for this visit. There is no height or weight on file to calculate BMI. Constitutional Well developed. Well nourished.  Vascular Dorsalis pedis pulses palpable bilaterally. Posterior tibial pulses palpable bilaterally. Capillary refill normal to all digits.  No cyanosis or clubbing noted. Pedal hair growth normal.  Neurologic Normal speech.  Oriented to person, place, and time. Epicritic sensation to light touch grossly present bilaterally.  Dermatologic Nails well groomed and normal in appearance. No open wounds. No skin lesions.  Orthopedic: Normal joint ROM without pain or crepitus bilaterally. No visible deformities. Tender to palpation at the calcaneal tuber right. No pain with calcaneal squeeze right. Ankle ROM full range of motion right. Silfverskiold Test: negative right.   Radiographs: None  Assessment:   No diagnosis found. Plan:  Patient was evaluated and treated and all questions answered.  Plantar Fasciitis, right - XR reviewed as above.  - Re-Educated on icing and stretching. Instructions given.  - Injection  delivered to the plantar fascia as below. - DME: Night splint - Pharmacologic management: None  -I explained to the patient that if her pain does not give resolved with this injection and if the pain stays the same I will have it completely immobilize her with a cam boot.  Patient agrees with this plan and will discuss it further during next visit.  I have also asked her to wear the plantar fascial brace when she is ambulating.  Right peroneal tendinitis -At this time I will hold off on treatment treating the peroneal tendinitis.  I believe this is due to compensation from the heel pain as patient is firing the peroneal tendinitis because she is walking on the outside of her foot.  Given the changes in the gait mechanics, once I treat the plantar fasciitis and if her pain is still present at the peroneal tendons I would treat it but I am confident that once the heel pain is treated the peroneal tendinitis will also resolve.  Procedure: Injection Tendon/Ligament Location: Left plantar fascia at the glabrous junction; medial approach. Skin Prep: alcohol Injectate: 0.5 cc 0.5% marcaine plain, 0.5 cc of 1% Lidocaine, 0.5 cc kenalog 10. Disposition: Patient tolerated procedure well. Injection site dressed with a band-aid.  No follow-ups on file.

## 2019-05-31 ENCOUNTER — Ambulatory Visit: Payer: Medicare Other | Attending: Internal Medicine

## 2019-05-31 DIAGNOSIS — Z23 Encounter for immunization: Secondary | ICD-10-CM | POA: Insufficient documentation

## 2019-05-31 NOTE — Progress Notes (Signed)
   Covid-19 Vaccination Clinic  Name:  [name]    MRN: [id number] DOB: [birth year]  05/31/2019  [name] was observed post Covid-19 immunization for 15 minutes without incidence. She was provided with Vaccine Information Sheet and instruction to access the V-Safe system.   [name] was instructed to call 911 with any severe reactions post vaccine: Marland Kitchen Difficulty breathing  . Swelling of your face and throat  . A fast heartbeat  . A bad rash all over your body  . Dizziness and weakness    Immunizations Administered    Name Date Dose VIS Date Route   Pfizer COVID-19 Vaccine 05/31/2019 11:06 AM 0.3 mL 04/17/2019 Intramuscular   Manufacturer: [employer], [employer]   Lot: [id number]   NDC: [address]-1

## 2019-06-22 ENCOUNTER — Other Ambulatory Visit: Payer: Self-pay

## 2019-06-22 ENCOUNTER — Ambulatory Visit (INDEPENDENT_AMBULATORY_CARE_PROVIDER_SITE_OTHER): Payer: Medicare Other | Admitting: Podiatry

## 2019-06-22 ENCOUNTER — Ambulatory Visit: Payer: Medicare Other | Attending: Internal Medicine

## 2019-06-22 DIAGNOSIS — M722 Plantar fascial fibromatosis: Secondary | ICD-10-CM

## 2019-06-22 DIAGNOSIS — M79671 Pain in right foot: Secondary | ICD-10-CM

## 2019-06-22 DIAGNOSIS — Z23 Encounter for immunization: Secondary | ICD-10-CM | POA: Insufficient documentation

## 2019-06-22 NOTE — Progress Notes (Signed)
   Covid-19 Vaccination Clinic  Name:  Mary Leblanc    MRN: [id number] DOB: 1941-01-19  06/22/2019  Mary Leblanc was observed post Covid-19 immunization for 15 minutes without incidence. She was provided with Vaccine Information Sheet and instruction to access the V-Safe system.   Mary Leblanc was instructed to call 911 with any severe reactions post vaccine: Marland Kitchen Difficulty breathing  . Swelling of your face and throat  . A fast heartbeat  . A bad rash all over your body  . Dizziness and weakness    Immunizations Administered    Name Date Dose VIS Date Route   Pfizer COVID-19 Vaccine 06/22/2019  8:57 AM 0.3 mL 04/17/2019 Intramuscular   Manufacturer: [employer], [employer]   Lot: [id number]   NDC: [id number]

## 2019-06-23 ENCOUNTER — Encounter: Payer: Self-pay | Admitting: Podiatry

## 2019-06-23 NOTE — Progress Notes (Signed)
Subjective:  Patient ID: Mary Leblanc, female    DOB: [birth year],  MRN: [id number]  Chief Complaint  Patient presents with  . Plantar Fasciitis    pt is here for a f/u on plantar fasciitis, pt states that she is not feeling better since the last time she was here, pt is concerned that the injection and the brace has not helped, and states that pain is a 10 out of 76.    79 y.o. female presents with the above complaint.  Patient is here for follow-up of right plantar fasciitis.  She states that there is still a lot of pain associated with the right foot.  She has tried injections and has failed all injection therapy.  She has orthotics in the shoes which has also failed.  She does not know if the plantar fascial brace is helping at all.  She has been wearing her night splint as well.  She has been doing her stretching as it.  None of that has helped.  She would like to know what is the next step to help improve the pain.  She states that she also has to go out of town in 4 weeks and will return within a week and will follow up afterwards.  She denies any other acute complaints.  Review of Systems: Negative except as noted in the HPI. Denies N/V/F/Ch.  Past Medical History:  Diagnosis Date  . Encounter for loop recorder check 02/14/2019  . Hyperlipidemia   . Syncope and collapse 01/22/2019    Current Outpatient Medications:  .  alendronate (FOSAMAX) 70 MG tablet, Take 70 mg by mouth once a week. Take with a full glass of water on an empty stomach., Disp: , Rfl:  .  amoxicillin (AMOXIL) 500 MG capsule, TAKE 2 CAPSULES BY MOUTH NOW THEN 1 CAPSULE 4 TIMES A DAY FOR DENTAL INFECTION, Disp: , Rfl:  .  aspirin EC 81 MG tablet, Take 81 mg by mouth daily., Disp: , Rfl:  .  B Complex-C (B-COMPLEX WITH VITAMIN C) tablet, Take 1 tablet by mouth daily., Disp: , Rfl:  .  Calcium Polycarbophil (FIBER-CAPS PO), Take by mouth., Disp: , Rfl:  .  calcium-vitamin D (OSCAL WITH D) 500-200 MG-UNIT tablet, Take 1  tablet by mouth., Disp: , Rfl:  .  chlorhexidine (PERIDEX) 0.12 % solution, SMARTSIG:0.5 Ounce(s) By Mouth Twice Daily, Disp: , Rfl:  .  gabapentin (NEURONTIN) 300 MG capsule, Take 600 mg by mouth 3 (three) times daily. , Disp: , Rfl:  .  ibuprofen (ADVIL) 800 MG tablet, Take 800 mg by mouth every 8 (eight) hours., Disp: , Rfl:  .  loperamide (IMODIUM) 2 MG capsule, Take by mouth as needed for diarrhea or loose stools., Disp: , Rfl:  .  methylPREDNISolone (MEDROL DOSEPAK) 4 MG TBPK tablet, Use as directed, Disp: 1 each, Rfl: 0 .  predniSONE (STERAPRED UNI-PAK 21 TAB) 10 MG (21) TBPK tablet, TAKE 6 TABLETS ON DAY 1 AS DIRECTED ON PACKAGE AND DECREASE BY 1 TAB EACH DAY FOR A TOTAL OF 6 DAYS, Disp: , Rfl:  .  rosuvastatin (CRESTOR) 10 MG tablet, Take 1 tablet (10 mg total) by mouth daily., Disp: 30 tablet, Rfl: 5  Social History   Tobacco Use  Smoking Status Former Smoker  . Packs/day: 1.00  . Years: 10.00  . Pack years: 10.00  . Types: Cigarettes  . Quit date: 90  . Years since quitting: 53.1  Smokeless Tobacco Never Used    No Known Allergies  Objective:   There were no vitals filed for this visit. There is no height or weight on file to calculate BMI. Constitutional Well developed. Well nourished.  Vascular Dorsalis pedis pulses palpable bilaterally. Posterior tibial pulses palpable bilaterally. Capillary refill normal to all digits.  No cyanosis or clubbing noted. Pedal hair growth normal.  Neurologic Normal speech. Oriented to person, place, and time. Epicritic sensation to light touch grossly present bilaterally.  Dermatologic Nails well groomed and normal in appearance. No open wounds. No skin lesions.  Orthopedic: Normal joint ROM without pain or crepitus bilaterally. No visible deformities. Tender to palpation at the calcaneal tuber right. No pain with calcaneal squeeze right. Ankle ROM full range of motion right. Silfverskiold Test: negative right.    Radiographs: None  Assessment:   1. Plantar fasciitis of right foot   2. Pain in right foot    Plan:  Patient was evaluated and treated and all questions answered.  Plantar Fasciitis, right - XR reviewed as above.  - Re-Educated on icing and stretching. Instructions given.  - DME: Cam boot - Pharmacologic management: None  -Cam boot was dispensed.  Given that patient has had no relief from all the previous conservative therapy, I believe complete immobilization of the right foot with a cam boot will help decrease some of the pain/inflammation associated with it.  However I discussed with the patient preliminary that if the cam boot does not help we will have to consider surgical intervention to help decrease some of the pain that she is having.  Patient states understanding and would like to immobilize her self with a cam boot prior to undergoing surgical intervention.  Right peroneal tendinitis -At this time I will hold off on treatment treating the peroneal tendinitis.  I believe this is due to compensation from the heel pain as patient is firing the peroneal tendinitis because she is walking on the outside of her foot.  Given the changes in the gait mechanics, once I treat the plantar fasciitis and if her pain is still present at the peroneal tendons I would treat it but I am confident that once the heel pain is treated the peroneal tendinitis will also resolve.   No follow-ups on file.

## 2019-06-29 ENCOUNTER — Telehealth: Payer: Self-pay | Admitting: Podiatry

## 2019-06-29 NOTE — Telephone Encounter (Signed)
Pt and husband presented to office for fitting for tall CAM boot. I fitted pt with a small size tall CAM Boot and allowed her to walk in the office to test. Pt states she is aware of the taller boot, but does not experience pain. I told pt she should perform ADL only to allow area to receive additional rest and decrease the possibility of irritation from the taller boot.

## 2019-06-29 NOTE — Telephone Encounter (Signed)
Hi valery,  Unfortunately, there are no other options. Yeah try the taller CAM boot see if that gives her relief.   Thanks  Nicholes Rough

## 2019-06-29 NOTE — Telephone Encounter (Signed)
Unable to leave a message mailbox is full. 

## 2019-06-29 NOTE — Telephone Encounter (Signed)
Pt called to say that the boot that the boot that Dr.Patel wants her to wear she says its to heavy and digging into her leg please advise

## 2019-06-29 NOTE — Telephone Encounter (Signed)
Pt called and states the boot is making a lesion behind the pump bubble and the additional pads are not helping. I offered to fit pt for a taller boot that may work better, also she will need to back off on her activities and perform only ADL, and ask Dr. Allena Katz for any other alternatives. Pt states she will come in today.

## 2019-07-10 ENCOUNTER — Other Ambulatory Visit: Payer: Self-pay

## 2019-07-10 ENCOUNTER — Ambulatory Visit
Admission: RE | Admit: 2019-07-10 | Discharge: 2019-07-10 | Disposition: A | Discharge status: Home / Self Care | Payer: Medicare Other | Source: Ambulatory Visit | Attending: Internal Medicine | Admitting: Internal Medicine

## 2019-07-10 DIAGNOSIS — Z1231 Encounter for screening mammogram for malignant neoplasm of breast: Secondary | ICD-10-CM

## 2019-07-13 ENCOUNTER — Ambulatory Visit: Payer: Medicare Other | Attending: Internal Medicine

## 2019-07-13 DIAGNOSIS — Z20822 Contact with and (suspected) exposure to covid-19: Secondary | ICD-10-CM

## 2019-07-14 LAB — NOVEL CORONAVIRUS, NAA: SARS-CoV-2, NAA: NOT DETECTED

## 2019-07-29 ENCOUNTER — Other Ambulatory Visit: Payer: Self-pay

## 2019-07-29 ENCOUNTER — Ambulatory Visit (INDEPENDENT_AMBULATORY_CARE_PROVIDER_SITE_OTHER): Payer: Medicare Other | Admitting: Podiatry

## 2019-07-29 DIAGNOSIS — M722 Plantar fascial fibromatosis: Secondary | ICD-10-CM | POA: Diagnosis not present

## 2019-07-29 DIAGNOSIS — M79671 Pain in right foot: Secondary | ICD-10-CM | POA: Diagnosis not present

## 2019-07-30 ENCOUNTER — Encounter: Payer: Self-pay | Admitting: Podiatry

## 2019-07-30 NOTE — Progress Notes (Signed)
Subjective:  Patient ID: Mary Leblanc, female    DOB: 1940-05-20,  MRN: [id number]  Chief Complaint  Patient presents with  . Plantar Fasciitis    pt is here for a f/u on plantar fasciitis of the right foot, pt states that she is feeling alot better when she is wearing the boot. Pt puts pain scale as a 9 out of 10 when she is not wearing the boot.    79 y.o. female presents with the above complaint.  Patient is here for follow-up of right plantar fasciitis.  She states that there is still a lot of pain associated with the right foot.  She has tried injections and has failed all injection therapy.  She has orthotics in the shoes which has also failed.  She does not know if the plantar fascial brace is helping at all.  She has been wearing her night splint as well.  She has been wearing her cam boot as well which does help a lot when she is in it however she is not able to transition herself out of it.  She states that there is still pain associated when she comes out of the boot for even a little bit of a time.  She denies any other acute complaints  Review of Systems: Negative except as noted in the HPI. Denies N/V/F/Ch.  Past Medical History:  Diagnosis Date  . Encounter for loop recorder check 02/14/2019  . Hyperlipidemia   . Syncope and collapse 01/22/2019    Current Outpatient Medications:  .  alendronate (FOSAMAX) 70 MG tablet, Take 70 mg by mouth once a week. Take with a full glass of water on an empty stomach., Disp: , Rfl:  .  amoxicillin (AMOXIL) 500 MG capsule, TAKE 2 CAPSULES BY MOUTH NOW THEN 1 CAPSULE 4 TIMES A DAY FOR DENTAL INFECTION, Disp: , Rfl:  .  aspirin EC 81 MG tablet, Take 81 mg by mouth daily., Disp: , Rfl:  .  B Complex-C (B-COMPLEX WITH VITAMIN C) tablet, Take 1 tablet by mouth daily., Disp: , Rfl:  .  Calcium Polycarbophil (FIBER-CAPS PO), Take by mouth., Disp: , Rfl:  .  calcium-vitamin D (OSCAL WITH D) 500-200 MG-UNIT tablet, Take 1 tablet by mouth., Disp: , Rfl:    .  chlorhexidine (PERIDEX) 0.12 % solution, SMARTSIG:0.5 Ounce(s) By Mouth Twice Daily, Disp: , Rfl:  .  gabapentin (NEURONTIN) 300 MG capsule, Take 600 mg by mouth 3 (three) times daily. , Disp: , Rfl:  .  ibuprofen (ADVIL) 800 MG tablet, Take 800 mg by mouth every 8 (eight) hours., Disp: , Rfl:  .  loperamide (IMODIUM) 2 MG capsule, Take by mouth as needed for diarrhea or loose stools., Disp: , Rfl:  .  methylPREDNISolone (MEDROL DOSEPAK) 4 MG TBPK tablet, Use as directed, Disp: 1 each, Rfl: 0 .  predniSONE (STERAPRED UNI-PAK 21 TAB) 10 MG (21) TBPK tablet, TAKE 6 TABLETS ON DAY 1 AS DIRECTED ON PACKAGE AND DECREASE BY 1 TAB EACH DAY FOR A TOTAL OF 6 DAYS, Disp: , Rfl:  .  rosuvastatin (CRESTOR) 10 MG tablet, Take 1 tablet (10 mg total) by mouth daily., Disp: 30 tablet, Rfl: 5  Social History   Tobacco Use  Smoking Status Former Smoker  . Packs/day: 1.00  . Years: 10.00  . Pack years: 10.00  . Types: Cigarettes  . Quit date: 21  . Years since quitting: 53.2  Smokeless Tobacco Never Used    No Known Allergies Objective:  There were no vitals filed for this visit. There is no height or weight on file to calculate BMI. Constitutional Well developed. Well nourished.  Vascular Dorsalis pedis pulses palpable bilaterally. Posterior tibial pulses palpable bilaterally. Capillary refill normal to all digits.  No cyanosis or clubbing noted. Pedal hair growth normal.  Neurologic Normal speech. Oriented to person, place, and time. Epicritic sensation to light touch grossly present bilaterally.  Dermatologic Nails well groomed and normal in appearance. No open wounds. No skin lesions.  Orthopedic: Normal joint ROM without pain or crepitus bilaterally. No visible deformities. Tender to palpation at the calcaneal tuber right. No pain with calcaneal squeeze right. Ankle ROM full range of motion right. Silfverskiold Test: negative right.   Radiographs: None  Assessment:   1.  Plantar fasciitis of right foot   2. Pain in right foot    Plan:  Patient was evaluated and treated and all questions answered.  Plantar Fasciitis, right - XR reviewed as above.  - Re-Educated on icing and stretching. Instructions given.  - DME: None - Pharmacologic management: None  -Patient will continue to wear the cam boot.  Patient states the cam boot does decrease her pain however she is not able to transition out of the cam boot into regular shoes. -I also believe patient will benefit from physical therapy for evaluation and management of plantar fasciitis to the right foot.  If she does not get relief from physical therapy I will consider surgical intervention during next clinical visit.  Right peroneal tendinitis -At this time I will hold off on treatment treating the peroneal tendinitis.  I believe this is due to compensation from the heel pain as patient is firing the peroneal tendinitis because she is walking on the outside of her foot.  Given the changes in the gait mechanics, once I treat the plantar fasciitis and if her pain is still present at the peroneal tendons I would treat it but I am confident that once the heel pain is treated the peroneal tendinitis will also resolve.   No follow-ups on file.

## 2019-08-04 ENCOUNTER — Telehealth: Payer: Self-pay | Admitting: *Deleted

## 2019-08-04 ENCOUNTER — Encounter: Payer: Self-pay | Admitting: Podiatry

## 2019-08-04 DIAGNOSIS — M79671 Pain in right foot: Secondary | ICD-10-CM

## 2019-08-04 DIAGNOSIS — M722 Plantar fascial fibromatosis: Secondary | ICD-10-CM

## 2019-08-04 DIAGNOSIS — M7731 Calcaneal spur, right foot: Secondary | ICD-10-CM

## 2019-08-04 DIAGNOSIS — M7671 Peroneal tendinitis, right leg: Secondary | ICD-10-CM

## 2019-08-04 NOTE — Telephone Encounter (Signed)
Dr. Allena Katz ordered PT for plantar fasciitis and general strengthening. Orders delivered to [facility].

## 2019-08-17 ENCOUNTER — Encounter: Payer: Self-pay | Admitting: Podiatry

## 2019-08-21 ENCOUNTER — Encounter: Payer: Self-pay | Admitting: Podiatry

## 2019-08-26 ENCOUNTER — Encounter: Payer: Self-pay | Admitting: Podiatry

## 2019-08-26 ENCOUNTER — Ambulatory Visit (INDEPENDENT_AMBULATORY_CARE_PROVIDER_SITE_OTHER): Payer: Medicare Other | Admitting: Podiatry

## 2019-08-26 ENCOUNTER — Other Ambulatory Visit: Payer: Self-pay

## 2019-08-26 DIAGNOSIS — M722 Plantar fascial fibromatosis: Secondary | ICD-10-CM

## 2019-08-26 DIAGNOSIS — M79671 Pain in right foot: Secondary | ICD-10-CM

## 2019-08-28 ENCOUNTER — Encounter: Payer: Self-pay | Admitting: Podiatry

## 2019-08-28 NOTE — Progress Notes (Signed)
Subjective:  Patient ID: Mary Leblanc, female    DOB: 11-07-1940,  MRN: [id number]  Chief Complaint  Patient presents with  . Foot Problem    the right foot is doing ok and the boot is not helping and hurts my back    79 y.o. female presents with the above complaint.  Patient is here for follow-up of right plantar fasciitis.  Patient states that she is doing a lot better.  She states that physical therapy has really helped her with the right foot.  She states that there is still some pain but she had to stop wearing the boot because her back was starting to hurt.  She has transition to regular sneakers.  There is some soreness and tenderness if up on it a lot however the physical therapy has helped her a lot.  She denies any other acute complaints.  She would like to know if she can continue physical therapy for a little bit longer to really decrease the pain.  Review of Systems: Negative except as noted in the HPI. Denies N/V/F/Ch.  Past Medical History:  Diagnosis Date  . Encounter for loop recorder check 02/14/2019  . Hyperlipidemia   . Syncope and collapse 01/22/2019    Current Outpatient Medications:  .  alendronate (FOSAMAX) 70 MG tablet, Take 70 mg by mouth once a week. Take with a full glass of water on an empty stomach., Disp: , Rfl:  .  amoxicillin (AMOXIL) 500 MG capsule, TAKE 2 CAPSULES BY MOUTH NOW THEN 1 CAPSULE 4 TIMES A DAY FOR DENTAL INFECTION, Disp: , Rfl:  .  aspirin EC 81 MG tablet, Take 81 mg by mouth daily., Disp: , Rfl:  .  B Complex-C (B-COMPLEX WITH VITAMIN C) tablet, Take 1 tablet by mouth daily., Disp: , Rfl:  .  Calcium Polycarbophil (FIBER-CAPS PO), Take by mouth., Disp: , Rfl:  .  calcium-vitamin D (OSCAL WITH D) 500-200 MG-UNIT tablet, Take 1 tablet by mouth., Disp: , Rfl:  .  chlorhexidine (PERIDEX) 0.12 % solution, SMARTSIG:0.5 Ounce(s) By Mouth Twice Daily, Disp: , Rfl:  .  gabapentin (NEURONTIN) 300 MG capsule, Take 600 mg by mouth 3 (three) times daily. ,  Disp: , Rfl:  .  ibuprofen (ADVIL) 800 MG tablet, Take 800 mg by mouth every 8 (eight) hours., Disp: , Rfl:  .  loperamide (IMODIUM) 2 MG capsule, Take by mouth as needed for diarrhea or loose stools., Disp: , Rfl:  .  methylPREDNISolone (MEDROL DOSEPAK) 4 MG TBPK tablet, Use as directed, Disp: 1 each, Rfl: 0 .  predniSONE (STERAPRED UNI-PAK 21 TAB) 10 MG (21) TBPK tablet, TAKE 6 TABLETS ON DAY 1 AS DIRECTED ON PACKAGE AND DECREASE BY 1 TAB EACH DAY FOR A TOTAL OF 6 DAYS, Disp: , Rfl:  .  rosuvastatin (CRESTOR) 10 MG tablet, Take 1 tablet (10 mg total) by mouth daily., Disp: 30 tablet, Rfl: 5  Social History   Tobacco Use  Smoking Status Former Smoker  . Packs/day: 1.00  . Years: 10.00  . Pack years: 10.00  . Types: Cigarettes  . Quit date: 41  . Years since quitting: 53.3  Smokeless Tobacco Never Used    No Known Allergies Objective:   There were no vitals filed for this visit. There is no height or weight on file to calculate BMI. Constitutional Well developed. Well nourished.  Vascular Dorsalis pedis pulses palpable bilaterally. Posterior tibial pulses palpable bilaterally. Capillary refill normal to all digits.  No cyanosis or clubbing  noted. Pedal hair growth normal.  Neurologic Normal speech. Oriented to person, place, and time. Epicritic sensation to light touch grossly present bilaterally.  Dermatologic Nails well groomed and normal in appearance. No open wounds. No skin lesions.  Orthopedic: Normal joint ROM without pain or crepitus bilaterally. No visible deformities. Mild tender to palpation at the calcaneal tuber right. No pain with calcaneal squeeze right. Ankle ROM full range of motion right. Silfverskiold Test: negative right.   Radiographs: None  Assessment:   1. Plantar fasciitis of right foot   2. Pain in right foot    Plan:  Patient was evaluated and treated and all questions answered.  Plantar Fasciitis, right - XR reviewed as above.  -  Re-Educated on icing and stretching. Instructions given.  - DME: None - Pharmacologic management: None  -Given that her pain has clinically improved after going multiple sessions of physical therapy I would like for her to start transitioning away from cam boot into regular sneakers.  It appears that patient have already begun that process. -Continue physical therapy for plantar fasciitis management as it appears to be helping her really well.  Right peroneal tendinitis -At this time I will hold off on treatment treating the peroneal tendinitis.  I believe this is due to compensation from the heel pain as patient is firing the peroneal tendinitis because she is walking on the outside of her foot.  Given the changes in the gait mechanics, once I treat the plantar fasciitis and if her pain is still present at the peroneal tendons I would treat it but I am confident that once the heel pain is treated the peroneal tendinitis will also resolve.   No follow-ups on file.

## 2019-09-16 ENCOUNTER — Other Ambulatory Visit: Payer: Self-pay | Admitting: Cardiology

## 2019-09-16 DIAGNOSIS — E782 Mixed hyperlipidemia: Secondary | ICD-10-CM

## 2019-10-12 ENCOUNTER — Ambulatory Visit (INDEPENDENT_AMBULATORY_CARE_PROVIDER_SITE_OTHER): Payer: Medicare Other | Admitting: Podiatry

## 2019-10-12 ENCOUNTER — Other Ambulatory Visit: Payer: Self-pay

## 2019-10-12 DIAGNOSIS — M722 Plantar fascial fibromatosis: Secondary | ICD-10-CM

## 2019-10-12 DIAGNOSIS — M79671 Pain in right foot: Secondary | ICD-10-CM | POA: Diagnosis not present

## 2019-10-12 NOTE — Progress Notes (Signed)
Subjective:  Patient ID: Mary Leblanc, female    DOB: May 09, 1940,  MRN: [id number]  Chief Complaint  Patient presents with  . Follow-up    6 wk rt foot plantar fasiitis. Pt feels much better, feels that physical therapy was helpful she is now back able to walk her 6 miles      79 y.o. female presents with the above complaint.  Patient is here for follow-up of right plantar fasciitis.  Patient states that she is doing a lot better.  She states that physical therapy has really helped her with the right foot.  She states that there is still some pain but she had to stop wearing the boot because her back was starting to hurt.  She has transition to regular sneakers.  There is some soreness and tenderness if up on it a lot however the physical therapy has helped her a lot.  She denies any other acute complaints.  She would like to know if she can continue physical therapy for a little bit longer to really decrease the pain.  Review of Systems: Negative except as noted in the HPI. Denies N/V/F/Ch.  Past Medical History:  Diagnosis Date  . Encounter for loop recorder check 02/14/2019  . Hyperlipidemia   . Syncope and collapse 01/22/2019    Current Outpatient Medications:  .  alendronate (FOSAMAX) 70 MG tablet, Take 70 mg by mouth once a week. Take with a full glass of water on an empty stomach., Disp: , Rfl:  .  amoxicillin (AMOXIL) 500 MG capsule, TAKE 2 CAPSULES BY MOUTH NOW THEN 1 CAPSULE 4 TIMES A DAY FOR DENTAL INFECTION, Disp: , Rfl:  .  aspirin EC 81 MG tablet, Take 81 mg by mouth daily., Disp: , Rfl:  .  B Complex-C (B-COMPLEX WITH VITAMIN C) tablet, Take 1 tablet by mouth daily., Disp: , Rfl:  .  Calcium Polycarbophil (FIBER-CAPS PO), Take by mouth., Disp: , Rfl:  .  calcium-vitamin D (OSCAL WITH D) 500-200 MG-UNIT tablet, Take 1 tablet by mouth., Disp: , Rfl:  .  chlorhexidine (PERIDEX) 0.12 % solution, SMARTSIG:0.5 Ounce(s) By Mouth Twice Daily, Disp: , Rfl:  .  gabapentin (NEURONTIN)  300 MG capsule, Take 600 mg by mouth 3 (three) times daily. , Disp: , Rfl:  .  ibuprofen (ADVIL) 800 MG tablet, Take 800 mg by mouth every 8 (eight) hours., Disp: , Rfl:  .  loperamide (IMODIUM) 2 MG capsule, Take by mouth as needed for diarrhea or loose stools., Disp: , Rfl:  .  methylPREDNISolone (MEDROL DOSEPAK) 4 MG TBPK tablet, Use as directed, Disp: 1 each, Rfl: 0 .  predniSONE (STERAPRED UNI-PAK 21 TAB) 10 MG (21) TBPK tablet, TAKE 6 TABLETS ON DAY 1 AS DIRECTED ON PACKAGE AND DECREASE BY 1 TAB EACH DAY FOR A TOTAL OF 6 DAYS, Disp: , Rfl:  .  rosuvastatin (CRESTOR) 10 MG tablet, TAKE 1 TABLET BY MOUTH EVERY DAY, Disp: 90 tablet, Rfl: 0  Social History   Tobacco Use  Smoking Status Former Smoker  . Packs/day: 1.00  . Years: 10.00  . Pack years: 10.00  . Types: Cigarettes  . Quit date: 55  . Years since quitting: 53.4  Smokeless Tobacco Never Used    No Known Allergies Objective:   There were no vitals filed for this visit. There is no height or weight on file to calculate BMI. Constitutional Well developed. Well nourished.  Vascular Dorsalis pedis pulses palpable bilaterally. Posterior tibial pulses palpable bilaterally. Capillary refill  normal to all digits.  No cyanosis or clubbing noted. Pedal hair growth normal.  Neurologic Normal speech. Oriented to person, place, and time. Epicritic sensation to light touch grossly present bilaterally.  Dermatologic Nails well groomed and normal in appearance. No open wounds. No skin lesions.  Orthopedic: Normal joint ROM without pain or crepitus bilaterally. No visible deformities. Mild tender to palpation at the calcaneal tuber right. No pain with calcaneal squeeze right. Ankle ROM full range of motion right. Silfverskiold Test: negative right.   Radiographs: None  Assessment:   1. Plantar fasciitis of right foot   2. Pain in right foot    Plan:  Patient was evaluated and treated and all questions  answered.  Plantar Fasciitis, right -Completely resolved.  Patient is able to do 6 miles a day walking without any acute issues.  At this time her pain is being completely managed with stretching exercises.  No further injections needed at this time. -At this time patient is officially discharged from my care if her plantar fasciitis pain recurs I have asked her to come back and see me. -She will also continue wearing orthotics.  Right peroneal tendinitis~resolving -At this time I will hold off on treatment treating the peroneal tendinitis.  I believe this is due to compensation from the heel pain as patient is firing the peroneal tendinitis because she is walking on the outside of her foot.  Given the changes in the gait mechanics, once I treat the plantar fasciitis and if her pain is still present at the peroneal tendons I would treat it but I am confident that once the heel pain is treated the peroneal tendinitis will also resolve.   No follow-ups on file.

## 2019-10-13 ENCOUNTER — Encounter: Payer: Self-pay | Admitting: Podiatry

## 2019-12-12 ENCOUNTER — Other Ambulatory Visit: Payer: Self-pay | Admitting: Cardiology

## 2019-12-12 DIAGNOSIS — E782 Mixed hyperlipidemia: Secondary | ICD-10-CM

## 2020-01-03 IMAGING — US US THYROID
1 series · 14 of 25 positions shown · non-contrast
Comparison: None.

CLINICAL DATA: Other. 78-year-old female with a history of thyroid
nodule. [place] of state and unavailable for
review.

EXAM:
THYROID ULTRASOUND
TECHNIQUE: Ultrasound examination of the thyroid gland and adjacent soft
tissues was performed.

[Series 1: us thyroid · 0.08mm/px · 14 of 42 slices shown]
[im 1/42]
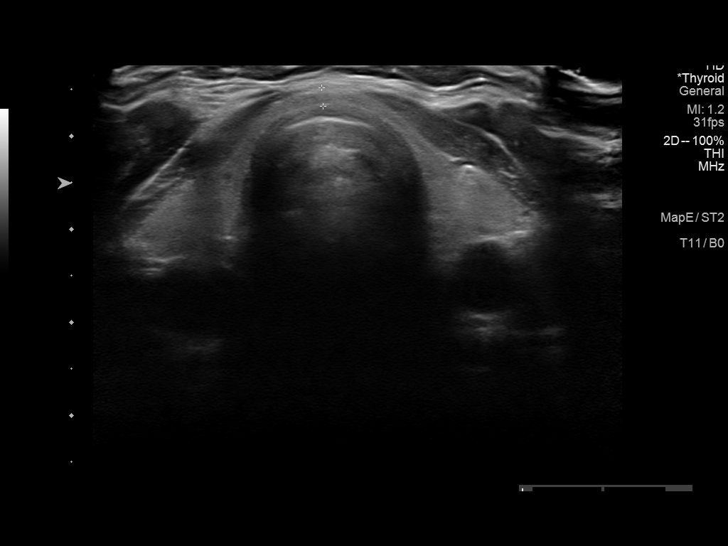
[im 4/42]
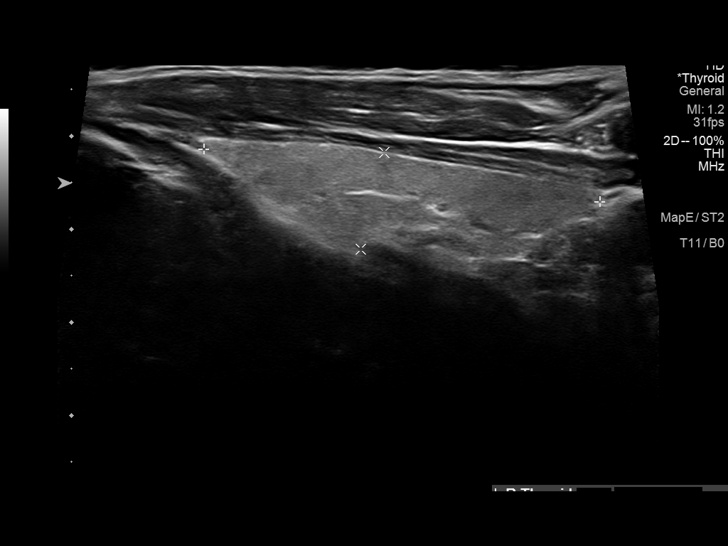
[im 7/42]
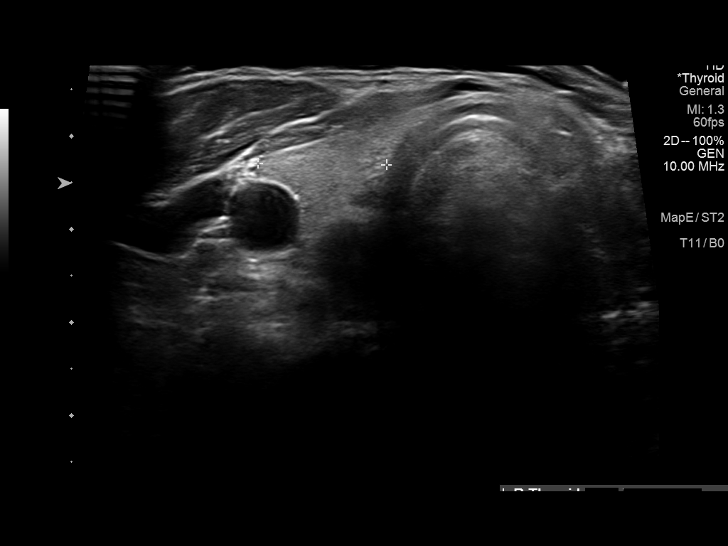
[im 11/42]
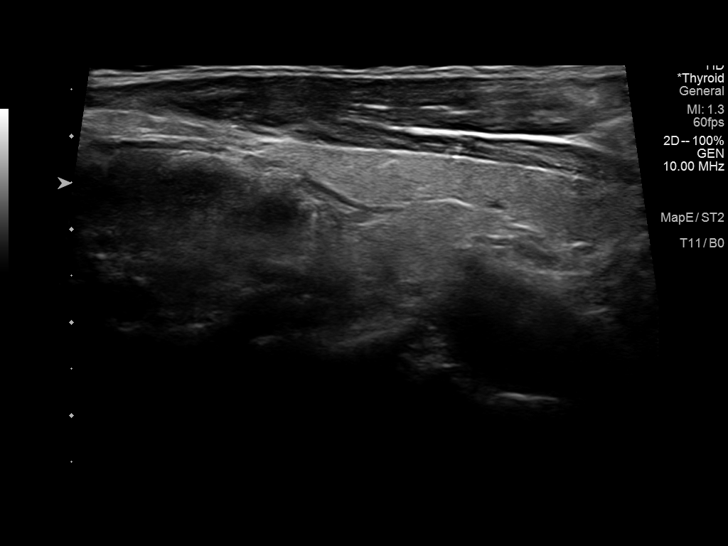
[im 14/42]
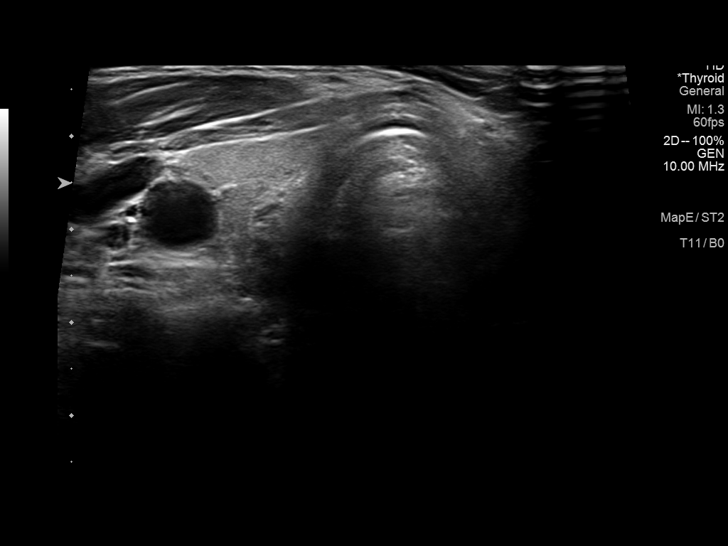
[im 16/42]
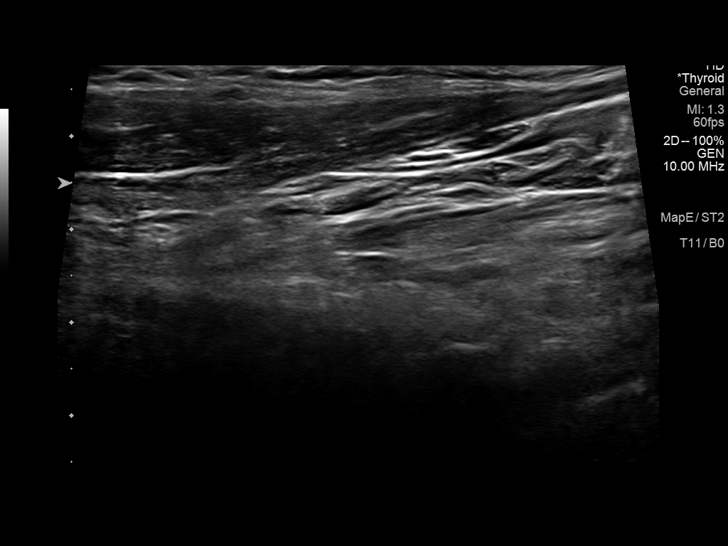
[im 19/42]
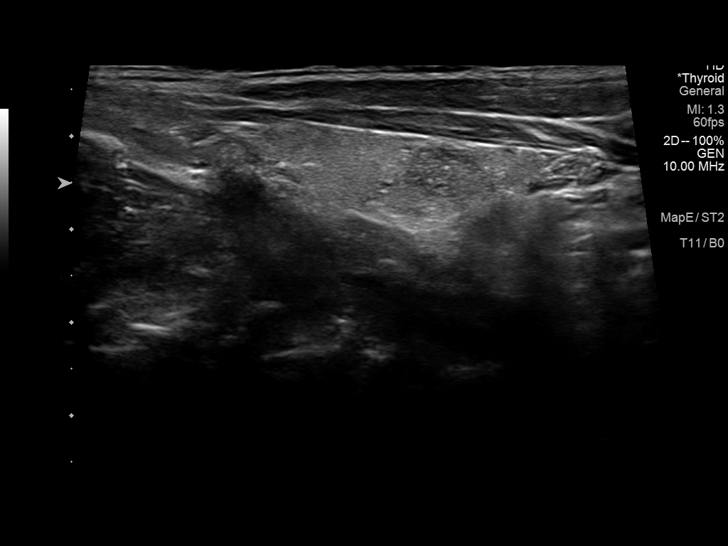
[im 23/42]
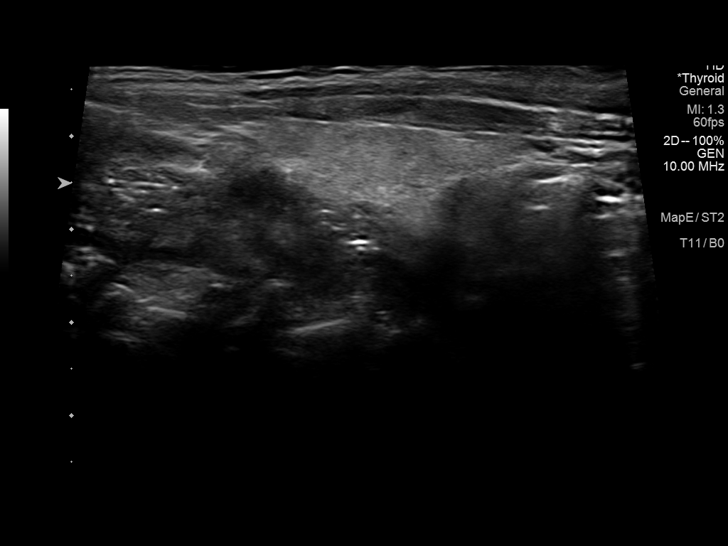
[im 26/42]
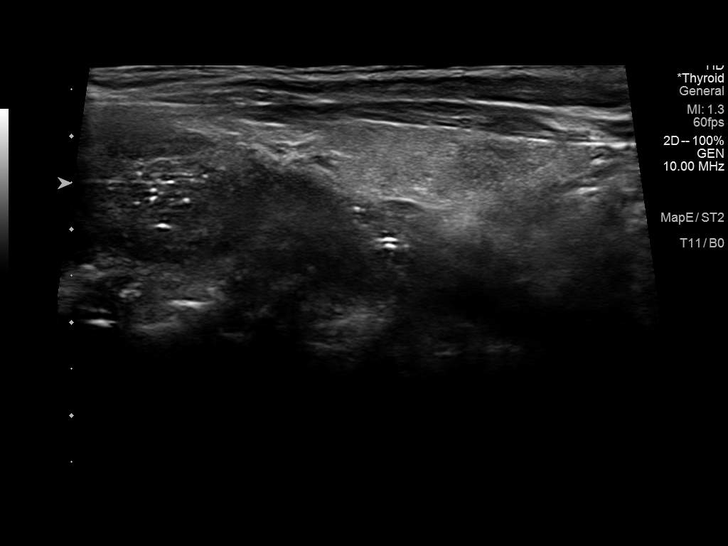
[im 28/42]
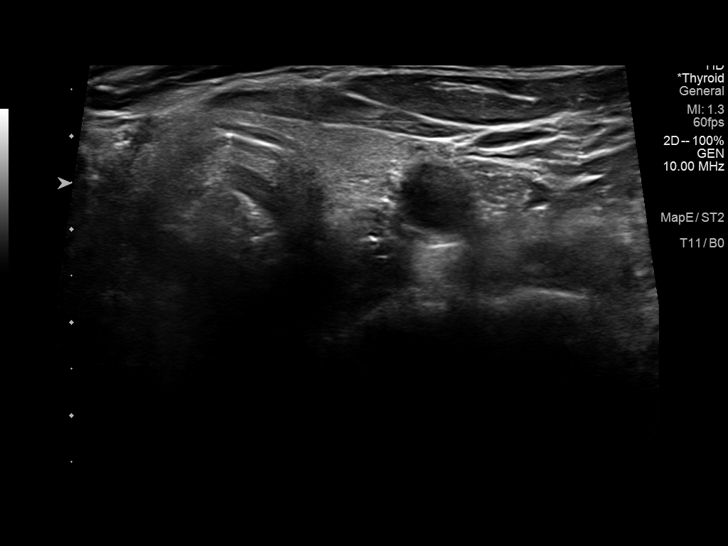
[im 31/42]
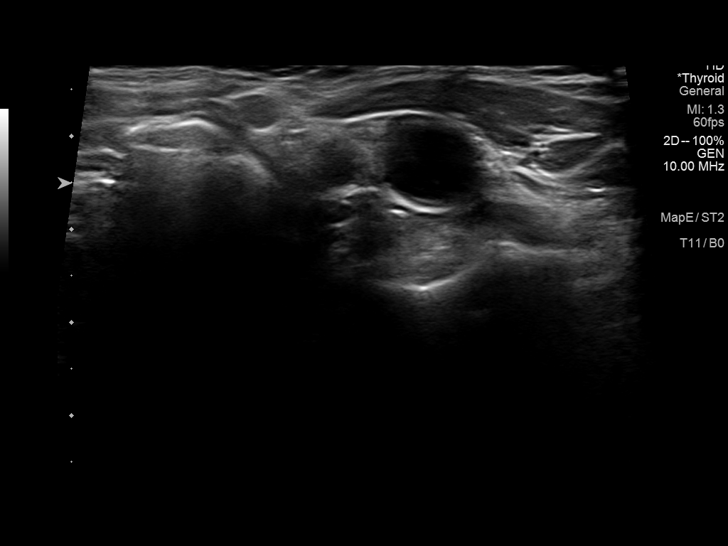
[im 35/42]
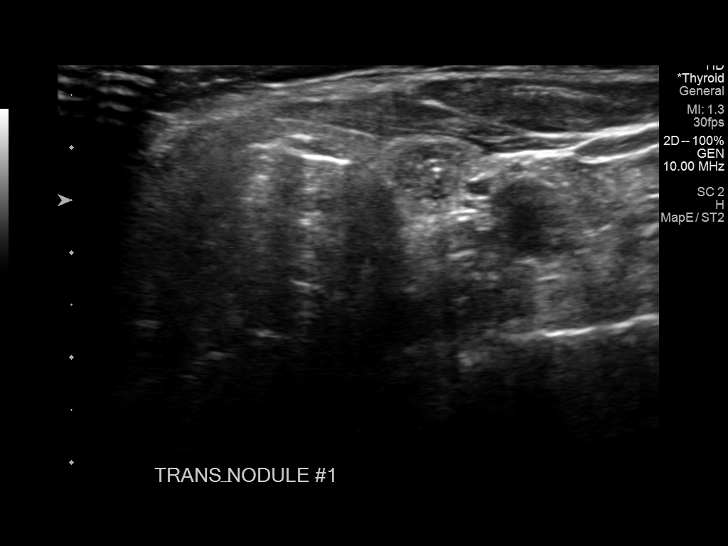
[im 38/42]
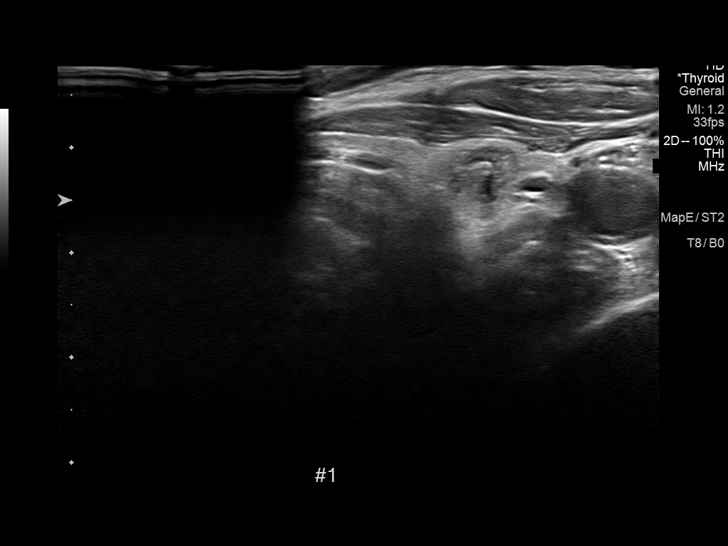
[im 42/42]
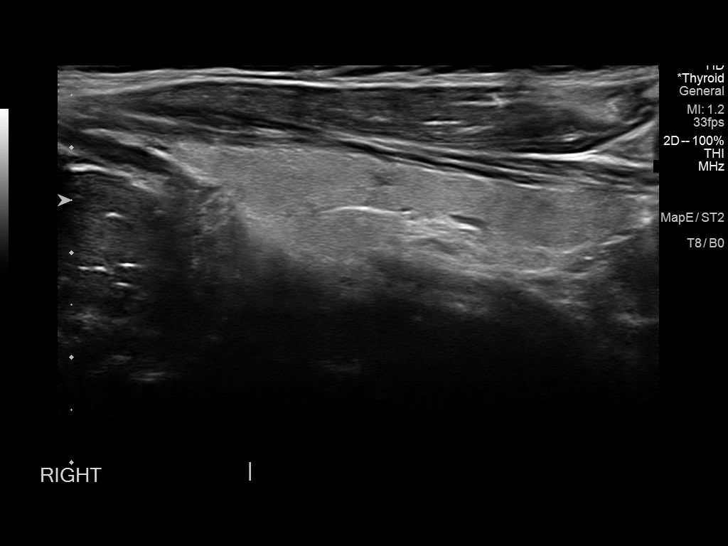

[14 of 25 positions shown; findings below may reference images not displayed]

FINDINGS: Parenchymal Echotexture: Normal

Isthmus: 0.2 cm

Right lobe: 4.3 x 1.1 x 1.4 cm

Left lobe: 3.3 x 1.0 x 1.4 cm

_________________________________________________________

Estimated total number of nodules >/= 1 cm: 0

Number of spongiform nodules >/=  2 cm not described below (TR1): 0

Number of mixed cystic and solid nodules >/= 1.5 cm not described
below (TR2): 0

_________________________________________________________

A small hypoechoic solid thyroid nodule with internal dystrophic
calcifications in the left mid gland measures up to 0.9 cm. This
nodule does not meet criteria for dedicated surveillance.
IMPRESSION: Small (less than 1 cm) nodule in the left mid gland noted
incidentally. This nodule does not meet criteria for biopsy or
dedicated surveillance.

The above is in keeping with the ACR TI-RADS recommendations - [HOSPITAL] 9304;[DATE].

## 2020-03-09 ENCOUNTER — Other Ambulatory Visit: Payer: Self-pay | Admitting: Cardiology

## 2020-03-09 DIAGNOSIS — E782 Mixed hyperlipidemia: Secondary | ICD-10-CM

## 2020-04-26 LAB — IFOBT (OCCULT BLOOD): IFOBT: NEGATIVE

## 2020-05-26 DIAGNOSIS — I10 Essential (primary) hypertension: Secondary | ICD-10-CM | POA: Insufficient documentation

## 2020-05-26 DIAGNOSIS — E782 Mixed hyperlipidemia: Secondary | ICD-10-CM | POA: Insufficient documentation

## 2020-05-26 NOTE — Progress Notes (Signed)
Patient referred by Gaspar Garbe, MD for syncope  Subjective:   Mary Leblanc, female    DOB: May 13, 1940, 80 y.o.   MRN: [id number]   Chief Complaint  Patient presents with  . Loss of Consciousness  . Hypertension  . Follow-up    HPI  80 y.o. Caucasian female with h/o syncope  Patient has noticed elevated blood pressure for past few months. She does admit being liberal with salt in her diet. She walks 4-5 miles without any symptoms.    Initial consult note HPI 01/22/2019: Patient is active and walks 6 miles a day 5 days a week without difficulty.  However, on 01/07/2019 at around 1:30 PM, patient had an episode of midsternal nonradiating chest pressure accompanied with shortness of breath while walking up the hill, along with sensation of "going out".  She lost consciousness and woke up to go passerby who stopped to help her.  EMS were called.  Her blood pressure was 60s over 30s, improved with IV fluids.  EKG in blood sugar were checked by EMS.  I personally reviewed the EKG performed by EMS, which showed sinus rhythm and prolonged QT interval at 499 ms.  Of note, this EKG was performed after patient regained consciousness and was no longer having chest pressure.  There were no ST segment changes on this EKG.  Patient was offered transfer to ED, but patient felt better and did not go to the ED. Since the episode, patient is back to walking up to 6 miles, although on flat ground without hills.   Patient has had several syncopal episodes in the past, usually at rest, associated with nausea, diaphoresis.  Her baseline EKG has had prolonged QT interval at times.  She has history of sudden death in her paternal grandfather at age 75.  Probably as a result of this, she underwent loop recorder placement at [facility] clinic in [place] while she was still in [place].  I personally reviewed the loop recorder monitoring today, which does not show any arrhythmias.    Patient is a retired Astronomer.  She has also worked in [place] status family.  She lives with her husband in [place] for several years.  They moved from [place] to [place] in January 2020.   Current Outpatient Medications on File Prior to Visit  Medication Sig Dispense Refill  . alendronate (FOSAMAX) 70 MG tablet Take 70 mg by mouth once a week. Take with a full glass of water on an empty stomach.    Marland Kitchen amoxicillin (AMOXIL) 500 MG capsule TAKE 2 CAPSULES BY MOUTH NOW THEN 1 CAPSULE 4 TIMES A DAY FOR DENTAL INFECTION    . aspirin EC 81 MG tablet Take 81 mg by mouth daily.    . B Complex-C (B-COMPLEX WITH VITAMIN C) tablet Take 1 tablet by mouth daily.    . Calcium Polycarbophil (FIBER-CAPS PO) Take by mouth.    . calcium-vitamin D (OSCAL WITH D) 500-200 MG-UNIT tablet Take 1 tablet by mouth.    . chlorhexidine (PERIDEX) 0.12 % solution SMARTSIG:0.5 Ounce(s) By Mouth Twice Daily    . gabapentin (NEURONTIN) 300 MG capsule Take 600 mg by mouth 3 (three) times daily.     Marland Kitchen ibuprofen (ADVIL) 800 MG tablet Take 800 mg by mouth every 8 (eight) hours.    Marland Kitchen loperamide (IMODIUM) 2 MG capsule Take by mouth as needed for diarrhea or loose stools.    . methylPREDNISolone (MEDROL DOSEPAK) 4 MG TBPK  tablet Use as directed 1 each 0  . predniSONE (STERAPRED UNI-PAK 21 TAB) 10 MG (21) TBPK tablet TAKE 6 TABLETS ON DAY 1 AS DIRECTED ON PACKAGE AND DECREASE BY 1 TAB EACH DAY FOR A TOTAL OF 6 DAYS    . rosuvastatin (CRESTOR) 10 MG tablet TAKE 1 TABLET BY MOUTH EVERY DAY 90 tablet 0   No current facility-administered medications on file prior to visit.    Cardiovascular studies:  EKG 05/27/2020: Sinus rhytm 54 bpm Normal EKG  Exercise tetrofosmin stress test  03/09/2019: Normal ECG stress. exercised for a total of 6 minutes and 0 seconds, achieving approximately 7.05 METs. Baseline heart rate was measured at 62 bpm. A maximum heart rate of 128 beats per minute was achieved, which is 90% of maximum predicted heart rate.   During exercise the patient developed  dyspnea, dizziness, & chest tightness 5/10. Exercise was terminated due to target heart rate achieved.  The calculated Duke Treadmill Score is 2.00 (Moderate risk) Normal myocardial perfusion. Stress LV EF: 62%. Low risk study.  No previous exam available for comparison.  Recent labs: 2020: Cholesterol 202, triglycerides 236, HDL 57, LDL 98. Apolipoprotein B 107 (<90)   Review of Systems  Cardiovascular: Negative for chest pain, dyspnea on exertion, leg swelling, palpitations and syncope.        Vitals:   05/27/20 1306  BP: (!) 149/84  Pulse: (!) 57  Resp: 16  Temp: 98.4 F (36.9 C)  SpO2: 99%     Objective:   Physical Exam Vitals and nursing note reviewed.  Constitutional:      General: She is not in acute distress. Neck:     Vascular: No JVD.  Cardiovascular:     Rate and Rhythm: Normal rate and regular rhythm.     Heart sounds: Normal heart sounds. No murmur heard.   Pulmonary:     Effort: Pulmonary effort is normal.     Breath sounds: Normal breath sounds. No wheezing or rales.           Assessment & Recommendations:   80 y.o. Caucasian female with h/o syncope, now with hypertension  Primary hypertension: Started valsartan-HCTZ 80-12.5 mg daily. Could increase, as tolerated and needed. Will check echocardiogram. Check BMP in 1 week. Counseled regarding low salt diet. Aspirin not needed for primary prevention.   Mixed hyperlipidemia: Continue rosuvastatin 10 mg  F/u in 4 weeks   Elder Negus, MD Pager: [phone] Office: [phone]

## 2020-05-27 ENCOUNTER — Encounter: Payer: Self-pay | Admitting: Cardiology

## 2020-05-27 ENCOUNTER — Ambulatory Visit: Payer: Medicare Other | Admitting: Cardiology

## 2020-05-27 ENCOUNTER — Other Ambulatory Visit: Payer: Self-pay

## 2020-05-27 VITALS — BP 149/84 | HR 57 | Temp 98.4°F | Resp 16 | Ht 60.0 in | Wt 110.0 lb

## 2020-05-27 DIAGNOSIS — E782 Mixed hyperlipidemia: Secondary | ICD-10-CM

## 2020-05-27 DIAGNOSIS — R55 Syncope and collapse: Secondary | ICD-10-CM

## 2020-05-27 DIAGNOSIS — I10 Essential (primary) hypertension: Secondary | ICD-10-CM

## 2020-05-27 MED ORDER — VALSARTAN-HYDROCHLOROTHIAZIDE 80-12.5 MG PO TABS: 1.0000 | ORAL_TABLET | Freq: Every day | ORAL | 1 refills | Status: DC

## 2020-05-31 LAB — BASIC METABOLIC PANEL
BUN/Creatinine Ratio: 18 (ref 12–28)
BUN: 16 mg/dL (ref 8–27)
CO2: 27 mmol/L (ref 20–29)
Calcium: 10.4 mg/dL — ABNORMAL HIGH (ref 8.7–10.3)
Chloride: 101 mmol/L (ref 96–106)
Creatinine, Ser: 0.89 mg/dL (ref 0.57–1.00)
GFR calc Af Amer: 72 mL/min/{1.73_m2} (ref >59)
GFR calc non Af Amer: 62 mL/min/{1.73_m2} (ref >59)
Glucose: 107 mg/dL — ABNORMAL HIGH (ref 65–99)
Potassium: 5.5 mmol/L — ABNORMAL HIGH (ref 3.5–5.2)
Sodium: 142 mmol/L (ref 134–144)

## 2020-05-31 NOTE — Addendum Note (Signed)
Addended by: Elder Negus on: 05/31/2020 08:59 AM   Modules accepted: Orders

## 2020-06-01 ENCOUNTER — Ambulatory Visit: Payer: Medicare Other

## 2020-06-01 ENCOUNTER — Other Ambulatory Visit: Payer: Self-pay | Admitting: Cardiology

## 2020-06-01 ENCOUNTER — Other Ambulatory Visit: Payer: Self-pay

## 2020-06-01 DIAGNOSIS — E782 Mixed hyperlipidemia: Secondary | ICD-10-CM

## 2020-06-01 DIAGNOSIS — I10 Essential (primary) hypertension: Secondary | ICD-10-CM

## 2020-06-09 ENCOUNTER — Encounter: Payer: Self-pay | Admitting: Nurse Practitioner

## 2020-06-09 ENCOUNTER — Encounter (HOSPITAL_COMMUNITY): Payer: Self-pay | Admitting: *Deleted

## 2020-06-09 ENCOUNTER — Other Ambulatory Visit: Payer: Self-pay

## 2020-06-09 ENCOUNTER — Emergency Department (HOSPITAL_COMMUNITY): Payer: Medicare Other

## 2020-06-09 ENCOUNTER — Emergency Department (HOSPITAL_COMMUNITY)
Admission: EM | Admit: 2020-06-09 | Discharge: 2020-06-09 | Disposition: A | Discharge status: Home / Self Care | Payer: Medicare Other | Attending: Emergency Medicine | Admitting: Emergency Medicine

## 2020-06-09 DIAGNOSIS — R55 Syncope and collapse: Secondary | ICD-10-CM | POA: Diagnosis not present

## 2020-06-09 DIAGNOSIS — Z87891 Personal history of nicotine dependence: Secondary | ICD-10-CM | POA: Insufficient documentation

## 2020-06-09 DIAGNOSIS — R109 Unspecified abdominal pain: Secondary | ICD-10-CM | POA: Diagnosis not present

## 2020-06-09 DIAGNOSIS — M542 Cervicalgia: Secondary | ICD-10-CM | POA: Insufficient documentation

## 2020-06-09 LAB — CBC
HCT: 36 % (ref 36.0–46.0)
Hemoglobin: 12.2 g/dL (ref 12.0–15.0)
MCH: 33.4 pg (ref 26.0–34.0)
MCHC: 33.9 g/dL (ref 30.0–36.0)
MCV: 98.6 fL (ref 80.0–100.0)
Platelets: 165 10*3/uL (ref 150–400)
RBC: 3.65 MIL/uL — ABNORMAL LOW (ref 3.87–5.11)
RDW: 12.5 % (ref 11.5–15.5)
WBC: 5 10*3/uL (ref 4.0–10.5)
nRBC: 0 % (ref 0.0–0.2)

## 2020-06-09 LAB — URINALYSIS, ROUTINE W REFLEX MICROSCOPIC
Bacteria, UA: NONE SEEN
Bilirubin Urine: NEGATIVE
Glucose, UA: NEGATIVE mg/dL
Hgb urine dipstick: NEGATIVE
Ketones, ur: NEGATIVE mg/dL
Nitrite: NEGATIVE
Protein, ur: NEGATIVE mg/dL
Specific Gravity, Urine: 1.018 (ref 1.005–1.030)
pH: 5 (ref 5.0–8.0)

## 2020-06-09 LAB — COMPREHENSIVE METABOLIC PANEL
ALT: 29 U/L (ref 0–44)
AST: 34 U/L (ref 15–41)
Albumin: 3.9 g/dL (ref 3.5–5.0)
Alkaline Phosphatase: 34 U/L — ABNORMAL LOW (ref 38–126)
Anion gap: 12 (ref 5–15)
BUN: 24 mg/dL — ABNORMAL HIGH (ref 8–23)
CO2: 23 mmol/L (ref 22–32)
Calcium: 9.8 mg/dL (ref 8.9–10.3)
Chloride: 101 mmol/L (ref 98–111)
Creatinine, Ser: 1.27 mg/dL — ABNORMAL HIGH (ref 0.44–1.00)
GFR, Estimated: 43 mL/min — ABNORMAL LOW (ref >60)
Glucose, Bld: 173 mg/dL — ABNORMAL HIGH (ref 70–99)
Potassium: 4.8 mmol/L (ref 3.5–5.1)
Sodium: 136 mmol/L (ref 135–145)
Total Bilirubin: 0.7 mg/dL (ref 0.3–1.2)
Total Protein: 6.6 g/dL (ref 6.5–8.1)

## 2020-06-09 LAB — ETHANOL: Alcohol, Ethyl (B): <10 mg/dL (ref <10)

## 2020-06-09 LAB — TROPONIN I (HIGH SENSITIVITY): Troponin I (High Sensitivity): 3 ng/L (ref <18)

## 2020-06-09 MED ORDER — SODIUM CHLORIDE 0.9 % IV BOLUS
1000.0000 mL | Freq: Once | INTRAVENOUS | Status: AC
  Administered 2020-06-09: 1000 mL via INTRAVENOUS

## 2020-06-09 NOTE — ED Notes (Addendum)
Pt reports that a week and a half ago she had extreme abdominal distress, significant weakness, and fainted. She has a history of syncope. Last night she woke up 2300 last night with abdominal discomfort and went to the bathroom where she fainted again, striking her forehead and was incontinent. Bruising noted over right eye. Reports neck pain.

## 2020-06-09 NOTE — ED Triage Notes (Signed)
The pt arrived by gems from home where the pt was weak and she fell striking her forehead she reports to drinking alcohol  Ems gave her of nss pt alert swollen area visible

## 2020-06-09 NOTE — ED Provider Notes (Signed)
[facility]PT [facility] Emergency Department Provider Note MRN:  [id number]  Arrival date & time: 06/09/20     Chief Complaint   Near Syncope   History of Present Illness   Mary Leblanc is a 80 y.o. year-old female with a history of syncope presenting to the ED with chief complaint of syncope.  2 times now over the past week patient has had syncopal episode while defecating.  Happened last week and again last night.  Explains she has been having the sudden urge to have a large bowel movement with abdominal cramping and discomfort followed by a "dumping" of a large amount of stool.  This occurred last night.  She then does not recall what happened, woke up on the ground.  Sustained a bruise to her forehead last night.  Denies any nausea or vomiting.  Denies any chest pain before or after the syncopal episode.  Endorsing some mild neck soreness with movement.  No arm or leg injuries or discomfort, abdominal pain is resolved after the bowel movement.  Explains that over the years she has had a long history of syncopal episodes.  Blood pressure was low with EMS, feeling better after fluids.  Review of Systems  A complete 10 system review of systems was obtained and all systems are negative except as noted in the HPI and PMH.   Patient's Health History    Past Medical History:  Diagnosis Date  . Encounter for loop recorder check 02/14/2019  . Hyperlipidemia   . Syncope and collapse 01/22/2019    Past Surgical History:  Procedure Laterality Date  . APPENDECTOMY  2000  . CHOLECYSTECTOMY  2013  . NECK SURGERY  2002    Family History  Problem Relation Age of Onset  . Hypertension Mother   . Stroke Mother   . Heart attack Father   . Diabetes Brother     Social History   Socioeconomic History  . Marital status: Married    Spouse name: Not on file  . Number of children: 2  . Years of education: Not on file  . Highest education level: Not on file  Occupational History  .  Not on file  Tobacco Use  . Smoking status: Former Smoker    Packs/day: 1.00    Years: 10.00    Pack years: 10.00    Types: Cigarettes    Quit date: 1968    Years since quitting: 54.1  . Smokeless tobacco: Never Used  Vaping Use  . Vaping Use: Never used  Substance and Sexual Activity  . Alcohol use: Yes    Comment: occ  . Drug use: Not Currently  . Sexual activity: Not on file  Other Topics Concern  . Not on file  Social History Narrative  . Not on file   Social Determinants of Health   Financial Resource Strain: Not on file  Food Insecurity: Not on file  Transportation Needs: Not on file  Physical Activity: Not on file  Stress: Not on file  Social Connections: Not on file  Intimate Partner Violence: Not on file     Physical Exam   Vitals:   06/09/20 0637 06/09/20 0639  BP: 123/69   Pulse: (!) 54   Resp: 13   Temp:    SpO2: 100% 99%    CONSTITUTIONAL: Well-appearing, NAD NEURO:  Alert and oriented x 3, no focal deficits EYES:  eyes equal and reactive ENT/NECK:  no LAD, no JVD CARDIO: Regular rate, well-perfused, normal S1 and S2  PULM:  CTAB no wheezing or rhonchi GI/GU:  normal bowel sounds, non-distended, non-tender MSK/SPINE:  No gross deformities, no edema SKIN:  no rash, atraumatic PSYCH:  Appropriate speech and behavior  *Additional and/or pertinent findings included in MDM below  Diagnostic and Interventional Summary    EKG Interpretation  Date/Time:  Thursday June 09 2020 01:17:21 EST Ventricular Rate:  50 PR Interval:  156 QRS Duration: 88 QT Interval:  534 QTC Calculation: 486 R Axis:   48 Text Interpretation: Sinus bradycardia Prolonged QT Abnormal ECG Confirmed by Kennis Carina [phone]) on 06/09/2020 6:10:28 AM      Labs Reviewed  COMPREHENSIVE METABOLIC PANEL - Abnormal; Notable for the following components:      Result Value   Glucose, Bld 173 (*)    BUN 24 (*)    Creatinine, Ser 1.27 (*)    Alkaline Phosphatase 34 (*)     GFR, Estimated 43 (*)    All other components within normal limits  CBC - Abnormal; Notable for the following components:   RBC 3.65 (*)    All other components within normal limits  URINALYSIS, ROUTINE W REFLEX MICROSCOPIC - Abnormal; Notable for the following components:   APPearance HAZY (*)    Leukocytes,Ua TRACE (*)    All other components within normal limits  ETHANOL  TROPONIN I (HIGH SENSITIVITY)    DG Chest Port 1 View    (Results Pending)    Medications  sodium chloride 0.9 % bolus 1,000 mL (has no administration in time range)     Procedures  /  Critical Care Procedures  ED Course and Medical Decision Making  I have reviewed the triage vital signs, the nursing notes, and pertinent available records from the EMR.  Listed above are laboratory and imaging tests that I personally ordered, reviewed, and interpreted and then considered in my medical decision making (see below for details).  Suspect benign syncopal event triggered by bowel movement.  Recent echo showing normal heart function.  EKG is demonstrating some nonspecific findings, biphasic T wave and so will screen with 2 troponins.  Mild AKI, will provide fluids.  Small bruise to the forehead, not anticoagulated, no nausea vomiting, normal neurological exam, no midline cervical spinal tenderness, normal range of motion of the neck, no indication for CNS or spinal imaging at this time.  With 2 negative troponins and continued normal cardiac monitoring patient would be appropriate for discharge with PCP follow-up.  Signed out to oncoming provider at shift change.       Elmer Sow. Pilar Plate, MD [facility] Emergency Medicine [facility] [email or web address]  Final Clinical Impressions(s) / ED Diagnoses     ICD-10-CM   1. Syncope, unspecified syncope type  R55     ED Discharge Orders    None       Discharge Instructions Discussed with and Provided to Patient:   Discharge Instructions   None        Sabas Sous, MD 06/09/20 [phone]

## 2020-06-09 NOTE — ED Provider Notes (Signed)
Care transferred to me.  Patient feels well.  Troponin is negative.  Her syncopal episode happened before midnight and so thus her troponin it is almost 7 hours or more after onset of syncope.  Has never had chest pain or shortness of breath with this.  Thus I think ACS is highly unlikely and after discussion with patient we will cancel second troponin and discharge.  I discussed syncope and work-up with her to some extent and it sound like she had a significant work-up including a loop recorder being placed a while ago as well as an recent echo.  I discussed she will need to follow-up with her PCP given these recurrent episodes as well as her cardiologist.  She is requesting GI referral because of GI issues and wonders if this is leading to the cause.  Otherwise she appears stable for discharge.   Pricilla Loveless, MD 06/09/20 [phone]

## 2020-06-18 ENCOUNTER — Other Ambulatory Visit: Payer: Self-pay | Admitting: Cardiology

## 2020-06-18 DIAGNOSIS — I10 Essential (primary) hypertension: Secondary | ICD-10-CM

## 2020-06-24 ENCOUNTER — Other Ambulatory Visit (INDEPENDENT_AMBULATORY_CARE_PROVIDER_SITE_OTHER): Payer: Medicare Other

## 2020-06-24 ENCOUNTER — Other Ambulatory Visit: Payer: Self-pay

## 2020-06-24 ENCOUNTER — Encounter: Payer: Self-pay | Admitting: Nurse Practitioner

## 2020-06-24 ENCOUNTER — Ambulatory Visit (INDEPENDENT_AMBULATORY_CARE_PROVIDER_SITE_OTHER): Payer: Medicare Other | Admitting: Nurse Practitioner

## 2020-06-24 VITALS — BP 116/60 | HR 60 | Ht 60.0 in | Wt 112.2 lb

## 2020-06-24 DIAGNOSIS — R55 Syncope and collapse: Secondary | ICD-10-CM

## 2020-06-24 DIAGNOSIS — R197 Diarrhea, unspecified: Secondary | ICD-10-CM

## 2020-06-24 DIAGNOSIS — R1030 Lower abdominal pain, unspecified: Secondary | ICD-10-CM

## 2020-06-24 LAB — CBC WITH DIFFERENTIAL/PLATELET
Basophils Absolute: 0 10*3/uL (ref 0.0–0.1)
Basophils Relative: 0.5 % (ref 0.0–3.0)
Eosinophils Absolute: 0.1 10*3/uL (ref 0.0–0.7)
Eosinophils Relative: 2.1 % (ref 0.0–5.0)
HCT: 35.9 % — ABNORMAL LOW (ref 36.0–46.0)
Hemoglobin: 12.4 g/dL (ref 12.0–15.0)
Lymphocytes Relative: 37.3 % (ref 12.0–46.0)
Lymphs Abs: 1.7 10*3/uL (ref 0.7–4.0)
MCHC: 34.6 g/dL (ref 30.0–36.0)
MCV: 95.6 fl (ref 78.0–100.0)
Monocytes Absolute: 0.3 10*3/uL (ref 0.1–1.0)
Monocytes Relative: 6.5 % (ref 3.0–12.0)
Neutro Abs: 2.4 10*3/uL (ref 1.4–7.7)
Neutrophils Relative %: 53.6 % (ref 43.0–77.0)
Platelets: 180 10*3/uL (ref 150.0–400.0)
RBC: 3.75 Mil/uL — ABNORMAL LOW (ref 3.87–5.11)
RDW: 12.6 % (ref 11.5–15.5)
WBC: 4.5 10*3/uL (ref 4.0–10.5)

## 2020-06-24 LAB — COMPREHENSIVE METABOLIC PANEL
ALT: 21 U/L (ref 0–35)
AST: 27 U/L (ref 0–37)
Albumin: 4.6 g/dL (ref 3.5–5.2)
Alkaline Phosphatase: 32 U/L — ABNORMAL LOW (ref 39–117)
BUN: 22 mg/dL (ref 6–23)
CO2: 30 mEq/L (ref 19–32)
Calcium: 9.9 mg/dL (ref 8.4–10.5)
Chloride: 93 mEq/L — ABNORMAL LOW (ref 96–112)
Creatinine, Ser: 1.01 mg/dL (ref 0.40–1.20)
GFR: 53.17 mL/min — ABNORMAL LOW (ref >60.00)
Glucose, Bld: 89 mg/dL (ref 70–99)
Potassium: 4.5 mEq/L (ref 3.5–5.1)
Sodium: 128 mEq/L — ABNORMAL LOW (ref 135–145)
Total Bilirubin: 0.8 mg/dL (ref 0.2–1.2)
Total Protein: 7.3 g/dL (ref 6.0–8.3)

## 2020-06-24 LAB — C-REACTIVE PROTEIN: CRP: <1 mg/dL (ref 0.5–20.0)

## 2020-06-24 NOTE — Patient Instructions (Addendum)
LABS:  Lab work has been ordered for you today. Our lab is located in the basement. Press "B" on the elevator. The lab is located at the first door on the left as you exit the elevator.  HEALTHCARE LAWS AND MY CHART RESULTS: Due to recent changes in healthcare laws, you may see the results of your imaging and laboratory studies on MyChart before your provider has had a chance to review them.   We understand that in some cases there may be results that are confusing or concerning to you. Not all laboratory results come back in the same time frame and the provider may be waiting for multiple results in order to interpret others.  Please give [place] 48 hours in order for your provider to thoroughly review all the results before contacting the office for clarification of your results.   IMAGING:  . You will be contacted by [facility] Scheduling (Your caller ID will indicate phone # [phone]) in the next 2 days to schedule your CT Scan. If you have not heard from them within 2 business days, please call [facility] Scheduling at [phone] to follow up on the status of your appointment.    Please follow up with your Cardiologist. Push fluids to avoid dehydration.  We have scheduled you a follow up with Dr. Barron Alvine on 07/28/20 at 8:40 AM at the [facility] office: [facility] [address] [place],  [place]  [address] Main: [phone]  Please call our office if your symptoms worsen. It was great seeing you today! Thank you for entrusting me with your care and choosing [facility].  Arnaldo Natal, CRNP

## 2020-06-24 NOTE — Progress Notes (Signed)
RADIOLOGY SCHEDULING REQUEST Routed to schedulers.  [facility] Phone: [phone] Fax: [phone]   Patient Name: Mary Leblanc DOB: [birth year] MRN #: [id number]  Imaging Ordered: CT abd pelvis  Diagnosis: diarrhea lower abd pain syncope  Ordering Provider: Arnaldo Natal, CRNP   Is a Prior Authorization needed? We are in the process of obtaining it now  Is the patient Diabetic? No  Does the patient have Hypertension? Yes  Does the patient have any implanted devices or hardware? No  Date of last BUN/Creat, if needed? 06/07/20  Patient Weight? 112lb  Is the patient able to get on the table? Yes  Has the patient been diagnosed with COVID? No  Is the patient waiting on COVID testing results? No  Thank you for your assistance! [facility] Team

## 2020-06-24 NOTE — Progress Notes (Signed)
06/24/2020 Wilson Sample [id number] 05/08/1940   CHIEF COMPLAINT: Syncope associated with large bowel movements  HISTORY OF PRESENT ILLNESS: Mary Leblanc is a 80 year old female with a past medical history of depression, hypertension, hyperlipidemia, remove history of a DVT while in nursing school, syncope s/p Loop recorder placement 12/2017.  S/P appendectomy, cholecystectomy 2013 and neck surgery.  She presents to our office today as referred by Dr. Osborne Casco for further evaluation regarding syncopal episodes associated with bowel movements.  She was diagnosed with COVID-19 infection March 2020.  During the time she had Covid 19  infection, she developed lower abdominal discomfort and went to the bathroom and sat on the commode.  She does not recall if she passed a BM or only urine at that time.  She stood up from the commode and briefly passed out in her bathroom hitting her face resulting in a nasal fracture.  Approximately 5 weeks ago, she went to the bathroom and passed a very large mud-like bowel movement without any noticeable blood.  She stood up from the commode and briefly passed out.  She went back and rested in her bed for the rest of the day.  The third and most recent episode of syncope occurred on 06/08/2020 around 11 PM at night.  She awakened from sleep with gripping lower abdominal pain, she went to the bathroom and passed a large nonbloody mud-like stool and she stood up from the commode and lost consciousness and hit her head on the bathroom floor.  She injured the front of her head and developed a black right. She presented to [facility] ED 2/3  for further evaluation.  In the ED, her heart rate was 54.  Blood pressure 123/69.  Oxygen saturation 100%.  A twelve-lead EKG showed sinus bradycardia with a prolonged QTc 486, some nonspecific findings and biphasic T waves.  Troponin level x2 was negative.  Sodium 136.  Potassium 4.8.  BUN 24.  Creatinine 1.27.  Alk phos 34.  Albumin 3.9.   Total bili 0.7.  AST 34.  ALT 29.  WBC 5.0.  Hemoglobin 12.2.  Hematocrit 36.0.  Platelet 165.  Ethyl alcohol less than 10.  She received IV fluids for her AKI.  CT of the head was not done.  CTAP was not done.  She was discharged home instructions to follow-up with her PCP.  She presents to our office today for further GI evaluation.  She denies having any nausea or vomiting.  No upper or lower abdominal pain at this time.  She is passing a softly formed to loose mudlike brown bowel movement daily.  No further dumping of loose stools since 2/2.  No rectal bleeding or melena.  She has fecal incontinence which is worse when her stools are loose.  She underwent a large tear episiotomy during the birth of her daughter which she attributes to progression of her fecal incontinence.  She previously took psyllium and Imodium with improvement.  However, after she had a gallbladder surgery about 15 years ago she had more frequent fecal incontinence.  She underwent a colonoscopy at the [facility] clinic in [place] in 10/15/2017 which showed external hemorrhoids, no polyps. Recall colonoscopy in 5 years. She is completed 6 or 7 colonoscopies in her lifetime and a single polyp was removed during 1 of these colonoscopies, further details are unclear.  No chest pain, palpitations or dizziness.  No cough or shortness of breath.  She walks 6 miles 5 days weekly.  She  has intentionally lost 15 pounds over the past 6 months.  She apparently had episodes of syncope in the past which usually occurred at rest for which she underwent a loop recorder basement 12/2017 while living in [place].  She is followed by local cardiologist Dr. Virgina Jock.  She is on Rosuvastatin 10 mg daily, aspirin was not required for primary CAD prevention.  She is due to see Dr. Virgina Jock on 06/29/2020 and her loop recorder readings will be reviewed at that time.   CBC Latest Ref Rng & Units 06/24/2020 06/09/2020  WBC 4.0 - 10.5 K/uL 4.5 5.0  Hemoglobin 12.0 -  15.0 g/dL 12.4 12.2  Hematocrit 36.0 - 46.0 % 35.9(L) 36.0  Platelets 150.0 - 400.0 K/uL 180.0 165   CMP Latest Ref Rng & Units 06/24/2020 06/09/2020 05/30/2020  Glucose 70 - 99 mg/dL 89 173(H) 107(H)  BUN 6 - 23 mg/dL 22 24(H) 16  Creatinine 0.40 - 1.20 mg/dL 1.01 1.27(H) 0.89  Sodium 135 - 145 mEq/L 128(L) 136 142  Potassium 3.5 - 5.1 mEq/L 4.5 4.8 5.5(H)  Chloride 96 - 112 mEq/L 93(L) 101 101  CO2 19 - 32 mEq/L '30 23 27  ' Calcium 8.4 - 10.5 mg/dL 9.9 9.8 10.4(H)  Total Protein 6.0 - 8.3 g/dL 7.3 6.6 -  Total Bilirubin 0.2 - 1.2 mg/dL 0.8 0.7 -  Alkaline Phos 39 - 117 U/L 32(L) 34(L) -  AST 0 - 37 U/L 27 34 -  ALT 0 - 35 U/L 21 29 -   Hemosure negative 04/26/2020  Past Medical History:  Diagnosis Date  . Anemia   . Depression   . Encounter for loop recorder check 02/14/2019  . GERD (gastroesophageal reflux disease)   . HTN (hypertension)   . Hyperlipidemia   . Osteopenia   . Syncope and collapse 01/22/2019   Past Surgical History:  Procedure Laterality Date  . APPENDECTOMY  2000  . CERVICAL LAMINOPLASTY  2002  . CHOLECYSTECTOMY  2013   Social History:  She is married. She is a retired Public librarian. She smoked 1ppd cigarettes age 51 - 54. No alcohol. No drug use.   Family History:  Mother deceased age 71 series of strokes. Father deceased age 36 PE, multiple heart attacks and bladder cancer. Brother with diabetes. Sister  No Known Allergies   Outpatient Encounter Medications as of 06/24/2020  Medication Sig  . alendronate (FOSAMAX) 70 MG tablet Take 70 mg by mouth once a week. Take with a full glass of water on an empty stomach.  Marland Kitchen aspirin EC 81 MG tablet Take 81 mg by mouth daily.  . B Complex-C (B-COMPLEX WITH VITAMIN C) tablet Take 1 tablet by mouth daily.  . Calcium Polycarbophil (FIBER-CAPS PO) Take by mouth.  . escitalopram (LEXAPRO) 10 MG tablet Take 10 mg by mouth daily.  Marland Kitchen gabapentin (NEURONTIN) 300 MG capsule Take 600 mg by mouth 3 (three) times daily.   Marland Kitchen  loperamide (IMODIUM) 2 MG capsule Take 4 mg by mouth at bedtime.  Marland Kitchen omeprazole (PRILOSEC) 20 MG capsule Take 20 mg by mouth daily.  . PSYLLIUM PO Take by mouth. 5 capsules by mouth twice a day  . rosuvastatin (CRESTOR) 10 MG tablet TAKE 1 TABLET BY MOUTH EVERY DAY  . valsartan-hydrochlorothiazide (DIOVAN-HCT) 80-12.5 MG tablet TAKE 1 TABLET BY MOUTH EVERY DAY  . [DISCONTINUED] ibuprofen (ADVIL) 800 MG tablet Take 800 mg by mouth every 8 (eight) hours.  . [DISCONTINUED] valsartan-hydrochlorothiazide (DIOVAN HCT) 80-12.5 MG tablet Take 1 tablet by  mouth daily.   No facility-administered encounter medications on file as of 06/24/2020.    REVIEW OF SYSTEMS: Gen: Denies fever, sweats or chills. No weight loss.  CV: Denies chest pain, palpitations or edema. Resp: Denies cough, shortness of breath of hemoptysis.  GI: See HPI. GU : Denies urinary burning, blood in urine, increased urinary frequency or incontinence. MS: + back pain.  Derm: Denies rash, itchiness, skin lesions or unhealing ulcers. Psych: + Depression.  Heme: Denies bruising, bleeding. Neuro:  Denies headaches, dizziness or paresthesias. Endo:  Denies any problems with DM, thyroid or adrenal function.  PHYSICAL EXAM: BP 116/60 (BP Location: Left Arm, Patient Position: Sitting, Cuff Size: Normal)   Pulse 60   Ht 5' (1.524 m) Comment: height measured without shoes  Wt 112 lb 4 oz (50.9 kg)   BMI 21.92 kg/m  General: 56 yer old female in no acute distress. Head: Normocephalic and atraumatic. Eyes:  Sclerae non-icteric, conjunctive pink. Ears: Normal auditory acuity. Mouth: Dentition intact. No ulcers or lesions.  Neck: Supple, no lymphadenopathy or thyromegaly.  Lungs: Clear bilaterally to auscultation without wheezes, crackles or rhonchi. Heart: Regular rate and rhythm. No murmur, rub or gallop appreciated.  Abdomen: Soft, nontender, non distended. No masses. No hepatosplenomegaly. Normoactive bowel sounds x 4 quadrants.   Rectal: Small external hemorrhoids. Small internal hemorrhoids without prolapse. Very diminished anal sphincter tone. No stool, blood or mass in the rectum.  Musculoskeletal: Symmetrical with no gross deformities. Skin: Warm and dry. No rash or lesions on visible extremities. Extremities: No edema. Neurological: Alert oriented x 4, no focal deficits.  Psychological:  Alert and cooperative. Normal mood and affect.  ASSESSMENT AND PLAN:  65.  80 year old female with recurrent syncopal episodes with suspected vasovagal response after passing a large bowel movements x 3 episodes. See HPI.  -CBC, CMP and CRP -CTAP with oral and IV contrast to further evaluate the small bowel and colon and to rule out intra-abdominal/pelvic mass/malignant pathology. BUN and Creatinine level to be reviewed prior to the patient proceeding with IV contrast. -Push fluids -Patient to continue with cardiology follow-up and loop recorder reading as scheduled 06/29/2020 -Further GI evaluation recommendations to be determined after the above lab and CT results reviewed -Patient to present to the ED if she has any further syncopal episodes  2.  Fecal incontinence -Consider pelvic floor physical therapy after the above evaluation completed -May consider Cholestyramine, to discuss further at next follow up appointment      CC:  Tisovec, Fransico Him, MD

## 2020-06-29 ENCOUNTER — Ambulatory Visit: Payer: Medicare Other | Admitting: Cardiology

## 2020-06-29 ENCOUNTER — Encounter: Payer: Self-pay | Admitting: Cardiology

## 2020-06-29 ENCOUNTER — Other Ambulatory Visit: Payer: Self-pay

## 2020-06-29 VITALS — BP 140/68 | HR 56 | Temp 97.6°F | Ht 60.0 in | Wt 114.0 lb

## 2020-06-29 DIAGNOSIS — E782 Mixed hyperlipidemia: Secondary | ICD-10-CM

## 2020-06-29 DIAGNOSIS — E871 Hypo-osmolality and hyponatremia: Secondary | ICD-10-CM

## 2020-06-29 DIAGNOSIS — R55 Syncope and collapse: Secondary | ICD-10-CM

## 2020-06-29 DIAGNOSIS — I10 Essential (primary) hypertension: Secondary | ICD-10-CM

## 2020-06-29 MED ORDER — AMLODIPINE BESYLATE 5 MG PO TABS: 5.0000 mg | ORAL_TABLET | Freq: Every day | ORAL | 1 refills | Status: DC

## 2020-06-29 NOTE — Progress Notes (Signed)
Agree with the assessment and plan as outlined by Alcide Evener, NP. Symptoms predominantly sound vasovagal in nature, although story confounded a bit by loose stools (rather than constipation/straining). Would typically treat with stool softener and/or laxative, but again, has mud-like stools and episodic fecal incontinence with decreased rectal tone exam. Can follow-up on CT along with results of loop recorder. In the meantime, agree with ensuring adequate fluid intake, and slowly rising from toilet after BM.   Doristine Locks, DO, [facility]

## 2020-06-29 NOTE — Progress Notes (Signed)
Patient referred by Haywood Pao, MD for syncope  Subjective:   Mary Leblanc, female    DOB: 02/11/1941, 80 y.o.   MRN: [id number]   Chief Complaint  Patient presents with  . Hypertension  . Follow-up    Patient was recently seen in [facility] emergency room early February 2022 after an episode of syncope.  Patient had been experiencing severe abdominal pain and large bowel movements.  She was sitting on toilet commode, when she had severe abdominal pain. Following this, she tried to get up and had sensation of blood running out of her body and she felt lost consciousness.  She also had fecal incontinence.  She was taken to emergency room with EMS.  Work-up showed no acute cardiac abnormalities.  Patient has since been seen by gastroenterology and is going to undergo CT scan with contrast of abdomen pelvis.  Other than episodes of severe abdominal pain, she does not have any syncopal episodes.  Of note, patient's loop recorder did not show any arrhythmia to coincide with her episode of syncope.  On a separate note, he does not have noted to be hyponatremic at 128 while on valsartan-hydrochlorothiazide.  Initial consult note HPI 01/22/2019: Patient is active and walks 6 miles a day 5 days a week without difficulty.  However, on 01/07/2019 at around 1:30 PM, patient had an episode of midsternal nonradiating chest pressure accompanied with shortness of breath while walking up the hill, along with sensation of "going out".  She lost consciousness and woke up to go passerby who stopped to help her.  EMS were called.  Her blood pressure was 60s over 30s, improved with IV fluids.  EKG in blood sugar were checked by EMS.  I personally reviewed the EKG performed by EMS, which showed sinus rhythm and prolonged QT interval at 499 ms.  Of note, this EKG was performed after patient regained consciousness and was no longer having chest pressure.  There were no ST segment changes on this EKG.  Patient was  offered transfer to ED, but patient felt better and did not go to the ED. Since the episode, patient is back to walking up to 6 miles, although on flat ground without hills.   Patient has had several syncopal episodes in the past, usually at rest, associated with nausea, diaphoresis.  Her baseline EKG has had prolonged QT interval at times.  She has history of sudden death in her paternal grandfather at age 78.  Probably as a result of this, she underwent loop recorder placement at [facility] clinic in [place] while she was still in [place].  I personally reviewed the loop recorder monitoring today, which does not show any arrhythmias.    Patient is a retired Dealer.  She has also worked in [place] status family.  She lives with her husband in [place] for several years.  They moved from [place] to [place] in January 2020.   Current Outpatient Medications on File Prior to Visit  Medication Sig Dispense Refill  . alendronate (FOSAMAX) 70 MG tablet Take 70 mg by mouth once a week. Take with a full glass of water on an empty stomach.    Marland Kitchen aspirin EC 81 MG tablet Take 81 mg by mouth daily.    . B Complex-C (B-COMPLEX WITH VITAMIN C) tablet Take 1 tablet by mouth daily.    . Calcium Polycarbophil (FIBER-CAPS PO) Take by mouth.    . escitalopram (LEXAPRO) 10 MG tablet Take  10 mg by mouth daily.    Marland Kitchen gabapentin (NEURONTIN) 300 MG capsule Take 600 mg by mouth 3 (three) times daily.     Marland Kitchen loperamide (IMODIUM) 2 MG capsule Take 4 mg by mouth at bedtime.    Marland Kitchen omeprazole (PRILOSEC) 20 MG capsule Take 20 mg by mouth daily.    . PSYLLIUM PO Take by mouth. 5 capsules by mouth twice a day    . rosuvastatin (CRESTOR) 10 MG tablet TAKE 1 TABLET BY MOUTH EVERY DAY 90 tablet 0  . valsartan-hydrochlorothiazide (DIOVAN-HCT) 80-12.5 MG tablet TAKE 1 TABLET BY MOUTH EVERY DAY 30 tablet 1   No current facility-administered medications on file prior to visit.    Cardiovascular  studies:  Scheduled Remote loop recorder check 06/07/2020:  The presenting rhythm is sinus arrhythmia. No symptomatic or automatic episodes were triggered. Daily activity trends are stable. Heart rate variability trends are stable. No heart block.  Continue remote monitoring.  Echocardiogram 06/01/2020:  Left ventricle cavity is normal in size and wall thickness. Normal global  wall motion. Normal LV systolic function with EF 66%. Doppler evidence of  grade I (impaired) diastolic dysfunction, normal LAP.  Mild tricuspid regurgitation.  No evidence of pulmonary hypertension.  EKG 05/27/2020: Sinus rhytm 54 bpm Normal EKG  Exercise tetrofosmin stress test  03/09/2019: Normal ECG stress. exercised for a total of 6 minutes and 0 seconds, achieving approximately 7.05 METs. Baseline heart rate was measured at 62 bpm. A maximum heart rate of 128 beats per minute was achieved, which is 90% of maximum predicted heart rate.  During exercise the patient developed  dyspnea, dizziness, & chest tightness 5/10. Exercise was terminated due to target heart rate achieved.  The calculated Duke Treadmill Score is 2.00 (Moderate risk) Normal myocardial perfusion. Stress LV EF: 62%. Low risk study.  No previous exam available for comparison.  Recent labs: 06/24/2020: Glucose 89, BUN/Cr 22/1.01. EGFR 53. Na/K 128/4.5. AlKP 32. Rest of the CMP normal  06/09/2020: Glucose 173, BUN/Cr 24/1.27. EGFR 43. Na/K 136/4.8. AlKP 34. Rest of the CMP normal  2020: Cholesterol 202, triglycerides 236, HDL 57, LDL 98. Apolipoprotein B 107 (<90)   Review of Systems  Cardiovascular: Positive for syncope. Negative for chest pain, dyspnea on exertion, leg swelling and palpitations.        Vitals:   06/29/20 1147  BP: 140/68  Pulse: (!) 56  Temp: 97.6 F (36.4 C)  SpO2: 100%     Objective:   Physical Exam Vitals and nursing note reviewed.  Constitutional:      General: She is not in acute distress. Neck:      Vascular: No JVD.  Cardiovascular:     Rate and Rhythm: Normal rate and regular rhythm.     Heart sounds: Normal heart sounds. No murmur heard.   Pulmonary:     Effort: Pulmonary effort is normal.     Breath sounds: Normal breath sounds. No wheezing or rales.           Assessment & Recommendations:   80 y.o. Caucasian female with hypertension, syncope, hyponatremia  Syncope: Likely vasovagal syncope, with trigger being acute abdominal pain and large bowel movement.  Encourage liberal hydration, consider wearing compression stockings.  Patient is undergoing work-up and management for her severe abdominal pain and large bowel movements.  Hopefully, this trigger can be controlled in future.  Hyponatremia: Sodium 128 while on valsartan-hydrochlorothiazide 80-12.5 mg daily.  Hyponatremia could be iatrogenic due to this medication.  Therefore, I have  stopped catheter in the hydrochlorothiazide.  Instead, I will start amlodipine 5 mg daily.  But she has had syncope, it is clearly triggered by her abdominal pain.  Therefore, I think she will tolerate amlodipine fairly well.  Continue home blood pressure checks.  We will repeat BMP in 2 weeks  Primary hypertension: As above.  Mixed hyperlipidemia: Continue rosuvastatin 10 mg  F/u in 4 weeks   Nigel Mormon, MD Pager: [phone] Office: [phone]

## 2020-06-30 ENCOUNTER — Telehealth: Payer: Self-pay | Admitting: Nurse Practitioner

## 2020-06-30 NOTE — Telephone Encounter (Signed)
Patient called said she takes Psyllium medication and wants to know if she needs to stop it prior to her CT.

## 2020-06-30 NOTE — Telephone Encounter (Signed)
Spoke to patient who has decided to skip her dose of Psyllium prior to her CT

## 2020-07-06 ENCOUNTER — Ambulatory Visit (HOSPITAL_COMMUNITY)
Admission: RE | Admit: 2020-07-06 | Discharge: 2020-07-06 | Disposition: A | Discharge status: Home / Self Care | Payer: Medicare Other | Source: Ambulatory Visit | Attending: Nurse Practitioner | Admitting: Nurse Practitioner

## 2020-07-06 ENCOUNTER — Other Ambulatory Visit: Payer: Self-pay

## 2020-07-06 DIAGNOSIS — R1030 Lower abdominal pain, unspecified: Secondary | ICD-10-CM | POA: Diagnosis present

## 2020-07-06 DIAGNOSIS — R197 Diarrhea, unspecified: Secondary | ICD-10-CM | POA: Insufficient documentation

## 2020-07-06 DIAGNOSIS — R55 Syncope and collapse: Secondary | ICD-10-CM | POA: Insufficient documentation

## 2020-07-06 MED ORDER — IOHEXOL 300 MG/ML  SOLN
100.0000 mL | Freq: Once | INTRAMUSCULAR | Status: AC | PRN
  Administered 2020-07-06: 100 mL via INTRAVENOUS

## 2020-07-07 ENCOUNTER — Telehealth: Payer: Self-pay | Admitting: Nurse Practitioner

## 2020-07-07 ENCOUNTER — Other Ambulatory Visit: Payer: Self-pay | Admitting: Nurse Practitioner

## 2020-07-07 DIAGNOSIS — K8689 Other specified diseases of pancreas: Secondary | ICD-10-CM

## 2020-07-07 NOTE — Telephone Encounter (Signed)
Thank you very much 

## 2020-07-07 NOTE — Telephone Encounter (Signed)
Patient reviewed her CT scan via Mychart. She is very upset with the findings,and would like a call back to go over the results with her. Jill Side, can you please call her?

## 2020-07-07 NOTE — Telephone Encounter (Signed)
Inbound call from patient requesting a call back please in regards to CT scan results.

## 2020-07-07 NOTE — Telephone Encounter (Signed)
Mary Leblanc, I called the patient at this time, however, she did not answer and I left a message on her voicemail that I would attempt to call her later this afternoon as I am not in the GI clinic today but in the hospital setting.  I have forwarded the CTAP results for Dr. Barron Alvine to review as well.  Refer to the CTAP side notes.

## 2020-07-15 ENCOUNTER — Other Ambulatory Visit: Payer: Self-pay | Admitting: Cardiology

## 2020-07-15 DIAGNOSIS — I10 Essential (primary) hypertension: Secondary | ICD-10-CM

## 2020-07-22 ENCOUNTER — Other Ambulatory Visit: Payer: Self-pay | Admitting: Cardiology

## 2020-07-22 DIAGNOSIS — I10 Essential (primary) hypertension: Secondary | ICD-10-CM

## 2020-07-24 ENCOUNTER — Other Ambulatory Visit: Payer: Self-pay

## 2020-07-24 ENCOUNTER — Ambulatory Visit
Admission: RE | Admit: 2020-07-24 | Discharge: 2020-07-24 | Disposition: A | Discharge status: Home / Self Care | Payer: Medicare Other | Source: Ambulatory Visit | Attending: Nurse Practitioner | Admitting: Nurse Practitioner

## 2020-07-24 DIAGNOSIS — K8689 Other specified diseases of pancreas: Secondary | ICD-10-CM

## 2020-07-24 MED ORDER — GADOBENATE DIMEGLUMINE 529 MG/ML IV SOLN
10.0000 mL | Freq: Once | INTRAVENOUS | Status: AC | PRN
  Administered 2020-07-24: 10 mL via INTRAVENOUS

## 2020-07-25 ENCOUNTER — Encounter: Payer: Self-pay | Admitting: Cardiology

## 2020-07-25 ENCOUNTER — Ambulatory Visit: Payer: Medicare Other | Admitting: Cardiology

## 2020-07-25 VITALS — BP 129/64 | HR 56 | Temp 98.0°F | Resp 16 | Ht 60.0 in | Wt 112.0 lb

## 2020-07-25 DIAGNOSIS — E782 Mixed hyperlipidemia: Secondary | ICD-10-CM

## 2020-07-25 DIAGNOSIS — I10 Essential (primary) hypertension: Secondary | ICD-10-CM

## 2020-07-25 DIAGNOSIS — E871 Hypo-osmolality and hyponatremia: Secondary | ICD-10-CM

## 2020-07-25 NOTE — Progress Notes (Signed)
Patient referred by Haywood Pao, MD for syncope  Subjective:   Mary Leblanc, female    DOB: Jun 08, 1940, 80 y.o.   MRN: [id number]   Chief Complaint  Patient presents with  . Syncope and collapse  . Follow-up  . Hypertension   80 y.o. Caucasian female with hypertension, syncope, hyponatremia  No recurrent syncope since last visit. She is found to have pancreatic duct dilatation on CT abdomen and is scheduled to undergo MRI abdomen. Blood pressure well controlled.     Current Outpatient Medications on File Prior to Visit  Medication Sig Dispense Refill  . alendronate (FOSAMAX) 70 MG tablet Take 70 mg by mouth once a week. Take with a full glass of water on an empty stomach.    Marland Kitchen amLODipine (NORVASC) 5 MG tablet TAKE 1 TABLET (5 MG TOTAL) BY MOUTH DAILY. 30 tablet 1  . aspirin EC 81 MG tablet Take 81 mg by mouth daily.    . B Complex-C (B-COMPLEX WITH VITAMIN C) tablet Take 1 tablet by mouth daily.    . Calcium Polycarbophil (FIBER-CAPS PO) Take by mouth.    . escitalopram (LEXAPRO) 10 MG tablet Take 10 mg by mouth daily.    Marland Kitchen gabapentin (NEURONTIN) 300 MG capsule Take 600 mg by mouth 3 (three) times daily.     Marland Kitchen loperamide (IMODIUM) 2 MG capsule Take 4 mg by mouth at bedtime.    Marland Kitchen omeprazole (PRILOSEC) 20 MG capsule Take 20 mg by mouth daily.    . PSYLLIUM PO Take by mouth. 5 capsules by mouth twice a day    . rosuvastatin (CRESTOR) 10 MG tablet TAKE 1 TABLET BY MOUTH EVERY DAY 90 tablet 0   No current facility-administered medications on file prior to visit.    Cardiovascular studies:  Scheduled Remote loop recorder check 06/07/2020:  The presenting rhythm is sinus arrhythmia. No symptomatic or automatic episodes were triggered. Daily activity trends are stable. Heart rate variability trends are stable. No heart block.  Continue remote monitoring.  Echocardiogram 06/01/2020:  Left ventricle cavity is normal in size and wall thickness. Normal global  wall motion.  Normal LV systolic function with EF 66%. Doppler evidence of  grade I (impaired) diastolic dysfunction, normal LAP.  Mild tricuspid regurgitation.  No evidence of pulmonary hypertension.  EKG 05/27/2020: Sinus rhytm 54 bpm Normal EKG  Exercise tetrofosmin stress test  03/09/2019: Normal ECG stress. exercised for a total of 6 minutes and 0 seconds, achieving approximately 7.05 METs. Baseline heart rate was measured at 62 bpm. A maximum heart rate of 128 beats per minute was achieved, which is 90% of maximum predicted heart rate.  During exercise the patient developed  dyspnea, dizziness, & chest tightness 5/10. Exercise was terminated due to target heart rate achieved.  The calculated Duke Treadmill Score is 2.00 (Moderate risk) Normal myocardial perfusion. Stress LV EF: 62%. Low risk study.  No previous exam available for comparison.  Recent labs: 06/24/2020: Glucose 89, BUN/Cr 22/1.01. EGFR 53. Na/K 128/4.5. AlKP 32. Rest of the CMP normal  06/09/2020: Glucose 173, BUN/Cr 24/1.27. EGFR 43. Na/K 136/4.8. AlKP 34. Rest of the CMP normal  2020: Cholesterol 202, triglycerides 236, HDL 57, LDL 98. Apolipoprotein B 107 (<90)   Review of Systems  Cardiovascular: Negative for chest pain, dyspnea on exertion, leg swelling, palpitations and syncope.        Vitals:   07/25/20 1429  BP: 129/64  Pulse: (!) 56  Resp: 16  Temp: 98 F (36.7  C)  SpO2: 98%     Objective:   Physical Exam Vitals and nursing note reviewed.  Constitutional:      General: She is not in acute distress. Neck:     Vascular: No JVD.  Cardiovascular:     Rate and Rhythm: Normal rate and regular rhythm.     Heart sounds: Normal heart sounds. No murmur heard.   Pulmonary:     Effort: Pulmonary effort is normal.     Breath sounds: Normal breath sounds. No wheezing or rales.           Assessment & Recommendations:   80 y.o. Caucasian female with hypertension, syncope, hyponatremia  Syncope: Likely  vasovagal syncope, with trigger being acute abdominal pain and large bowel movement. No recurrence.   Hyponatremia: Stopped valsartan-HCTZ Repeat BMP   Primary hypertension: Controlled on amlodipine 5 mg,  Mixed hyperlipidemia: Continue rosuvastatin 10 mg  F/u in 6 months   Nigel Mormon, MD Pager: [phone] Office: [phone]

## 2020-07-26 ENCOUNTER — Encounter: Payer: Self-pay | Admitting: Cardiology

## 2020-07-28 ENCOUNTER — Encounter: Payer: Self-pay | Admitting: Gastroenterology

## 2020-07-28 ENCOUNTER — Ambulatory Visit (INDEPENDENT_AMBULATORY_CARE_PROVIDER_SITE_OTHER): Payer: Medicare Other | Admitting: Gastroenterology

## 2020-07-28 VITALS — BP 116/60 | HR 64 | Ht 60.0 in | Wt 111.1 lb

## 2020-07-28 DIAGNOSIS — R159 Full incontinence of feces: Secondary | ICD-10-CM | POA: Diagnosis not present

## 2020-07-28 DIAGNOSIS — R109 Unspecified abdominal pain: Secondary | ICD-10-CM

## 2020-07-28 DIAGNOSIS — K8689 Other specified diseases of pancreas: Secondary | ICD-10-CM | POA: Diagnosis not present

## 2020-07-28 DIAGNOSIS — K839 Disease of biliary tract, unspecified: Secondary | ICD-10-CM

## 2020-07-28 DIAGNOSIS — R151 Fecal smearing: Secondary | ICD-10-CM

## 2020-07-28 NOTE — H&P (View-Only) (Signed)
  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  S/P appendectomy, cholecystectomy 2013 and neck surgery.    Initially seen in the GI clinic on 06/24/2020 for evaluation of syncopal events associated with bowel movements.  No associated hematochezia.  Has had 3 episodes in total, starting in 07/2018 then 05/2020 and 06/2020.  Does have associated lower abdominal pain and passing large, nonbloody, mud-like stools with subsequent syncope. -05/2020: TTE: Grade 1 diastolic dysfunction, mild TR, EF 66%, otherwise normal -06/09/2020: Syncopal event at home, hit head on bathroom floor.  Evaluated in [facility] ER.  HR 54, otherwise hemodynamically stable.  EKG with QTC 486.  Trop negative, mild AKI, otherwise normal CMP, CBC.  Treated with IV fluids.  No imaging done. -06/24/2020: Evaluated in the GI clinic.  No repeat episodes since ER evaluation.  Does report history of fecal incontinence presumed 2/2 large tear episiotomy during birth of daughter, which she had treated with psyllium and Imodium with improvement.  Worsening fecal incontinence after CCY in 2007.   -06/24/2020: Na 128, otherwise normal CMP.  Normal CBC, CRP -06/29/2020: Evaluated by Dr. Patwardhan in Cardiology with review of loop recorder readings.  Diagnosed with likely vasovagal syncope with trigger being acute abdominal pain and large BM -07/06/2020: CT A/P: Diffusely dilated pancreatic duct to the ampulla measuring 6 mm.  Otherwise unremarkable -07/24/2020: MRI/MRCP: Very mild hepatic steatosis, 8 mm simple hepatic cyst.  CBD 12 mm and mildly dilated intrahepatic ducts without CDL.  Main PD ranges from diffusely ectatic to mildly dilated at 3-4 mm without obstructing lesion.  No pancreatic mass or inflammatory changes.  Bilateral simple renal cysts.  Endoscopic History: -Colonoscopy (10/2017, [facility] in [place]): External hemorrhoids, no polyps.  Repeat in 5 years. -Has completed 6-7 colonoscopies in her lifetime, and one single polyp removed in total  HPI:     Patient is a 80 y.o. female presenting to the Gastroenterology Clinic for follow-up.  She maintains an active lifestyle, walking 6 miles, 5 days a week.  Has intentionally lost 15#over the last 6 months with diet/exercise.  Still having episodic fecal incontinence and fecal seepage.  Still with loose, "mud-like" stools along with intermittent lower abdominal cramping.  Review of systems:     No chest pain, no SOB, no fevers, no urinary sx   Past Medical History:  Diagnosis Date  . Anemia   . Depression   . Encounter for loop recorder check 02/14/2019  . GERD (gastroesophageal reflux disease)   . HTN (hypertension)   . Hyperlipidemia   . Osteopenia   . Syncope and collapse 01/22/2019    Patient's surgical history, family medical history, social history, medications and allergies were all reviewed in Epic    Current Outpatient Medications  Medication Sig Dispense Refill  . alendronate (FOSAMAX) 70 MG tablet Take 70 mg by mouth once a week. Take with a full glass of water on an empty stomach.    . amLODipine (NORVASC) 5 MG tablet TAKE 1 TABLET (5 MG TOTAL) BY MOUTH DAILY. 30 tablet 1  . aspirin EC 81 MG tablet Take 81 mg by mouth daily.    . B Complex-C (B-COMPLEX WITH VITAMIN C) tablet Take 1 tablet by mouth daily.    . Calcium Polycarbophil (FIBER-CAPS PO) Take by mouth.    . escitalopram (LEXAPRO) 10 MG tablet   Take 10 mg by mouth daily.    Marland Kitchen gabapentin (NEURONTIN) 300 MG capsule Take 600 mg by mouth 3 (three) times daily.     Marland Kitchen loperamide (IMODIUM) 2 MG capsule Take 4 mg by mouth at bedtime.    . Methylcellulose, Laxative, (CITRUCEL PO) Take 1 tablet by mouth at bedtime.    Marland Kitchen omeprazole (PRILOSEC) 20 MG capsule Take 20 mg by mouth daily.    . rosuvastatin (CRESTOR) 10 MG tablet TAKE 1 TABLET BY MOUTH EVERY DAY  90 tablet 0   No current facility-administered medications for this visit.    Physical Exam:     BP 116/60   Pulse 64   Ht 5' (1.524 m)   Wt 111 lb 2 oz (50.4 kg)   BMI 21.70 kg/m   GENERAL:  Pleasant female in NAD PSYCH: : Cooperative, normal affect NEURO: Alert and oriented x 3, no focal neurologic deficits   IMPRESSION and PLAN:    1) Abnormal pancreatic duct 2) Dilated common bile duct -While the mildly dilated CBD could be related to her prior ccy, she also has mildly dilated and ectatic PD.  Had a long discussion regarding DDx today, to include benign and obstructive pathologies.  Discussed evaluation options, to include surveillance imaging vs EUS, and strongly prefer the latter. -Referral for nonemergent EUS with either Dr. Meridee Score or Dr. Christella Hartigan  3) Fecal seepage/fecal incontinence 4) Change in bowel habits -Trial of cholestyramine -Check fecal elastase -Referral for Anorectal Manometry -Interestingly, does have history of episiotomy.  If above work-up unrevealing, may consider referral for rectal EUS to evaluate for anal sphincter muscle disruption  5) Abdominal cramping -Trial low FODMAP diet.  Provided with diet handout and detailed instruction today -Discussed potentially trialing course of Bentyl, Symax, etc.  -RTC in 3 months or sooner as needed     I spent 35 minutes of time, including in depth chart review, independent review of results as outlined above, communicating results with the patient directly, face-to-face time with the patient, coordinating care, and ordering studies and medications as appropriate, and documentation.      Shellia Cleverly ,DO, FACG 07/28/2020, 8:53 AM

## 2020-07-28 NOTE — Progress Notes (Signed)
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  S/P appendectomy, cholecystectomy 2013 and neck surgery.    Initially seen in the GI clinic on 06/24/2020 for evaluation of syncopal events associated with bowel movements.  No associated hematochezia.  Has had 3 episodes in total, starting in 07/2018 then 05/2020 and 06/2020.  Does have associated lower abdominal pain and passing large, nonbloody, mud-like stools with subsequent syncope. -05/2020: TTE: Grade 1 diastolic dysfunction, mild TR, EF 66%, otherwise normal -06/09/2020: Syncopal event at home, hit head on bathroom floor.  Evaluated in [facility] ER.  HR 54, otherwise hemodynamically stable.  EKG with QTC 486.  Trop negative, mild AKI, otherwise normal CMP, CBC.  Treated with IV fluids.  No imaging done. -06/24/2020: Evaluated in the GI clinic.  No repeat episodes since ER evaluation.  Does report history of fecal incontinence presumed 2/2 large tear episiotomy during birth of daughter, which she had treated with psyllium and Imodium with improvement.  Worsening fecal incontinence after CCY in 2007.   -06/24/2020: Na 128, otherwise normal CMP.  Normal CBC, CRP -06/29/2020: Evaluated by Dr. Rosemary Holms in Cardiology with review of loop recorder readings.  Diagnosed with likely vasovagal syncope with trigger being acute abdominal pain and large BM -07/06/2020: CT A/P: Diffusely dilated pancreatic duct to the ampulla measuring 6 mm.  Otherwise unremarkable -07/24/2020: MRI/MRCP: Very mild hepatic steatosis, 8 mm simple hepatic cyst.  CBD 12 mm and mildly dilated intrahepatic ducts without CDL.  Main PD ranges from diffusely ectatic to mildly dilated at 3-4 mm without obstructing lesion.  No pancreatic mass or inflammatory changes.  Bilateral simple renal cysts.  Endoscopic History: -Colonoscopy (10/2017, [facility] in [place]): External hemorrhoids, no polyps.  Repeat in 5 years. -Has completed 6-7 colonoscopies in her lifetime, and one single polyp removed in total  HPI:     Patient is a 80 y.o. female presenting to the Gastroenterology Clinic for follow-up.  She maintains an active lifestyle, walking 6 miles, 5 days a week.  Has intentionally lost 15#over the last 6 months with diet/exercise.  Still having episodic fecal incontinence and fecal seepage.  Still with loose, "mud-like" stools along with intermittent lower abdominal cramping.  Review of systems:     No chest pain, no SOB, no fevers, no urinary sx   Past Medical History:  Diagnosis Date  . Anemia   . Depression   . Encounter for loop recorder check 02/14/2019  . GERD (gastroesophageal reflux disease)   . HTN (hypertension)   . Hyperlipidemia   . Osteopenia   . Syncope and collapse 01/22/2019    Patient's surgical history, family medical history, social history, medications and allergies were all reviewed in Epic    Current Outpatient Medications  Medication Sig Dispense Refill  . alendronate (FOSAMAX) 70 MG tablet Take 70 mg by mouth once a week. Take with a full glass of water on an empty stomach.    Marland Kitchen amLODipine (NORVASC) 5 MG tablet TAKE 1 TABLET (5 MG TOTAL) BY MOUTH DAILY. 30 tablet 1  . aspirin EC 81 MG tablet Take 81 mg by mouth daily.    . B Complex-C (B-COMPLEX WITH VITAMIN C) tablet Take 1 tablet by mouth daily.    . Calcium Polycarbophil (FIBER-CAPS PO) Take by mouth.    . escitalopram (LEXAPRO) 10 MG tablet  Take 10 mg by mouth daily.    Marland Kitchen gabapentin (NEURONTIN) 300 MG capsule Take 600 mg by mouth 3 (three) times daily.     Marland Kitchen loperamide (IMODIUM) 2 MG capsule Take 4 mg by mouth at bedtime.    . Methylcellulose, Laxative, (CITRUCEL PO) Take 1 tablet by mouth at bedtime.    Marland Kitchen omeprazole (PRILOSEC) 20 MG capsule Take 20 mg by mouth daily.    . rosuvastatin (CRESTOR) 10 MG tablet TAKE 1 TABLET BY MOUTH EVERY DAY  90 tablet 0   No current facility-administered medications for this visit.    Physical Exam:     BP 116/60   Pulse 64   Ht 5' (1.524 m)   Wt 111 lb 2 oz (50.4 kg)   BMI 21.70 kg/m   GENERAL:  Pleasant female in NAD PSYCH: : Cooperative, normal affect NEURO: Alert and oriented x 3, no focal neurologic deficits   IMPRESSION and PLAN:    1) Abnormal pancreatic duct 2) Dilated common bile duct -While the mildly dilated CBD could be related to her prior ccy, she also has mildly dilated and ectatic PD.  Had a long discussion regarding DDx today, to include benign and obstructive pathologies.  Discussed evaluation options, to include surveillance imaging vs EUS, and strongly prefer the latter. -Referral for nonemergent EUS with either Dr. Meridee Score or Dr. Christella Hartigan  3) Fecal seepage/fecal incontinence 4) Change in bowel habits -Trial of cholestyramine -Check fecal elastase -Referral for Anorectal Manometry -Interestingly, does have history of episiotomy.  If above work-up unrevealing, may consider referral for rectal EUS to evaluate for anal sphincter muscle disruption  5) Abdominal cramping -Trial low FODMAP diet.  Provided with diet handout and detailed instruction today -Discussed potentially trialing course of Bentyl, Symax, etc.  -RTC in 3 months or sooner as needed     I spent 35 minutes of time, including in depth chart review, independent review of results as outlined above, communicating results with the patient directly, face-to-face time with the patient, coordinating care, and ordering studies and medications as appropriate, and documentation.      Shellia Cleverly ,DO, FACG 07/28/2020, 8:53 AM

## 2020-07-28 NOTE — Patient Instructions (Signed)
If you are age 79 or older, your body mass index should be between 23-30. Your Body mass index is 21.7 kg/m. If this is out of the aforementioned range listed, please consider follow up with your Primary Care Provider.  If you are age 65 or younger, your body mass index should be between 19-25. Your Body mass index is 21.7 kg/m. If this is out of the aformentioned range listed, please consider follow up with your Primary Care Provider.   You will hear from our office to schedule your Endoscopic ultrasound and anorectal manometry. If you do not hear from [place] within 5 business days please contact the office.  We have sent the following medications to your pharmacy for you to pick up at your convenience:  Cholestyramine  Due to recent changes in healthcare laws, you may see the results of your imaging and laboratory studies on MyChart before your provider has had a chance to review them.  We understand that in some cases there may be results that are confusing or concerning to you. Not all laboratory results come back in the same time frame and the provider may be waiting for multiple results in order to interpret others.  Please give [place] 48 hours in order for your provider to thoroughly review all the results before contacting the office for clarification of your results.   Thank you for choosing me and [facility].  Doristine Locks, D.O.  Low FODMAP Diet: (Fermentable Oligosaccharides, Disaccharides, Monosaccharides, and Polyols) These are short chain carbohydrates and sugar alcohols that are poorly absorbed by the body, resulting in multiple abdominal symptoms, including changes in bowel habits, abdominal pain/discomfort, bloating, abdominal distension, gas, etc.

## 2020-07-29 ENCOUNTER — Other Ambulatory Visit: Payer: Self-pay

## 2020-07-29 ENCOUNTER — Telehealth: Payer: Self-pay

## 2020-07-29 DIAGNOSIS — K839 Disease of biliary tract, unspecified: Secondary | ICD-10-CM

## 2020-07-29 DIAGNOSIS — K8689 Other specified diseases of pancreas: Secondary | ICD-10-CM

## 2020-07-29 NOTE — Telephone Encounter (Signed)
-----   Message from Rachael Fee, MD sent at 07/29/2020  5:37 AM EDT ----- Regarding: RE: EUS referral Vito, I think a good view of ampulla with a duodenoscope and EUS look at her pancreas, biliary tree is reasonable.  Alphonso Gregson, She needs upper EUS with myself or GM, first available for mild 'double duct' sign.  Thanks  Constance Goltz  ----- Message ----- From: Shellia Cleverly, DO Sent: 07/28/2020   3:13 PM EDT To: Rachael Fee, MD, # Subject: EUS referral                                   Gents-  I placed a referral for EUS (should be able to go direct for EUS without need for clinic appointment first) for evaluation of dilated CBD and abnormal PD on CT and subsequent MRI/MRCP.  No obvious obstructing lesion in HOP otherwise appears clear.  Discussed ampullary lesions, IPMN, etc.  And she feels comfortable with proceeding direct to EUS if you agree.  Has had prior cholecystectomy, but not sure how that would explain PD irregularity.  Think this is reasonable to go for EUS to further evaluate?  Thanks!  NIKE

## 2020-07-29 NOTE — Telephone Encounter (Signed)
EUS scheduled with Dr Meridee Score on 09/15/20 at 730 am at [facility].  COVID test on 09/12/20 at 1030 am   EUS scheduled, pt instructed and medications reviewed.  Patient instructions mailed to home.  Patient to call with any questions or concerns.

## 2020-08-11 ENCOUNTER — Other Ambulatory Visit (HOSPITAL_COMMUNITY)
Admission: RE | Admit: 2020-08-11 | Discharge: 2020-08-11 | Disposition: A | Discharge status: Home / Self Care | Payer: Medicare Other | Source: Ambulatory Visit | Attending: Gastroenterology | Admitting: Gastroenterology

## 2020-08-11 DIAGNOSIS — Z01812 Encounter for preprocedural laboratory examination: Secondary | ICD-10-CM | POA: Diagnosis present

## 2020-08-11 DIAGNOSIS — Z20822 Contact with and (suspected) exposure to covid-19: Secondary | ICD-10-CM | POA: Diagnosis not present

## 2020-08-11 LAB — SARS CORONAVIRUS 2 (TAT 6-24 HRS): SARS Coronavirus 2: NEGATIVE

## 2020-08-13 ENCOUNTER — Other Ambulatory Visit: Payer: Self-pay | Admitting: Cardiology

## 2020-08-13 DIAGNOSIS — I10 Essential (primary) hypertension: Secondary | ICD-10-CM

## 2020-08-15 ENCOUNTER — Encounter (HOSPITAL_COMMUNITY)
Admission: RE | Disposition: A | Discharge status: Home / Self Care | Payer: Self-pay | Source: Home / Self Care | Attending: Gastroenterology

## 2020-08-15 ENCOUNTER — Ambulatory Visit (HOSPITAL_COMMUNITY)
Admission: RE | Admit: 2020-08-15 | Discharge: 2020-08-15 | Disposition: A | Discharge status: Home / Self Care | Payer: Medicare Other | Attending: Gastroenterology | Admitting: Gastroenterology

## 2020-08-15 ENCOUNTER — Encounter (HOSPITAL_COMMUNITY): Payer: Self-pay | Admitting: Gastroenterology

## 2020-08-15 DIAGNOSIS — Z87891 Personal history of nicotine dependence: Secondary | ICD-10-CM | POA: Insufficient documentation

## 2020-08-15 DIAGNOSIS — Z7983 Long term (current) use of bisphosphonates: Secondary | ICD-10-CM | POA: Insufficient documentation

## 2020-08-15 DIAGNOSIS — K5902 Outlet dysfunction constipation: Secondary | ICD-10-CM | POA: Diagnosis not present

## 2020-08-15 DIAGNOSIS — Z8616 Personal history of COVID-19: Secondary | ICD-10-CM | POA: Diagnosis not present

## 2020-08-15 DIAGNOSIS — Z7982 Long term (current) use of aspirin: Secondary | ICD-10-CM | POA: Diagnosis not present

## 2020-08-15 DIAGNOSIS — Z79899 Other long term (current) drug therapy: Secondary | ICD-10-CM | POA: Diagnosis not present

## 2020-08-15 DIAGNOSIS — R159 Full incontinence of feces: Secondary | ICD-10-CM

## 2020-08-15 HISTORY — PX: ANAL RECTAL MANOMETRY: SHX6358

## 2020-08-15 SURGERY — MANOMETRY, ANORECTAL

## 2020-08-15 NOTE — Progress Notes (Addendum)
Anorectal manometry performed per protocol.  Patient tolerated well with no problems or concerns.  50 cc balloon was then placed in rectum.  Patient was able to expel balloon in 30 seconds.  Report to be sent to Dr Marsa Aris.

## 2020-08-15 NOTE — Interval H&P Note (Signed)
History and Physical Interval Note:  08/15/2020 8:50 AM  Mary Leblanc  has presented today for surgery, with the diagnosis of Fecal incontinence.  The various methods of treatment have been discussed with the patient and family. After consideration of risks, benefits and other options for treatment, the patient has consented to  Procedure(s): ANO RECTAL MANOMETRY (N/A) as a surgical intervention.  The patient's history has been reviewed, patient examined, no change in status, stable for surgery.  I have reviewed the patient's chart and labs.  Questions were answered to the patient's satisfaction.     Verlin Dike Soham Hollett

## 2020-08-16 ENCOUNTER — Encounter (HOSPITAL_COMMUNITY): Payer: Self-pay | Admitting: Gastroenterology

## 2020-08-20 ENCOUNTER — Other Ambulatory Visit: Payer: Self-pay | Admitting: Cardiology

## 2020-08-20 DIAGNOSIS — I10 Essential (primary) hypertension: Secondary | ICD-10-CM

## 2020-08-29 ENCOUNTER — Other Ambulatory Visit: Payer: Self-pay | Admitting: Cardiology

## 2020-08-29 DIAGNOSIS — E782 Mixed hyperlipidemia: Secondary | ICD-10-CM

## 2020-09-06 NOTE — Telephone Encounter (Signed)
Spoke with patient in regards to her ano-rectal manometry results and recommendations. Patient would like to proceed with referral to [facility], advised that they will be able to discuss more about the Interstim implants. Advised patient that if she has not heard from them within 1-2 weeks she will need to give [place] a call and let us know. Patient thanked me for the call, verbalized understanding of all information and had no concerns at the end of the call.  Referral, records, demographic and insurance information faxed to [facility].

## 2020-09-06 NOTE — Telephone Encounter (Signed)
A copy of the anorectal manometry results have sent to be scanned into patient's chart. I have kept a copy as well.

## 2020-09-08 ENCOUNTER — Other Ambulatory Visit: Payer: Self-pay | Admitting: Cardiology

## 2020-09-08 DIAGNOSIS — I10 Essential (primary) hypertension: Secondary | ICD-10-CM

## 2020-09-09 ENCOUNTER — Other Ambulatory Visit: Payer: Self-pay | Admitting: Internal Medicine

## 2020-09-09 DIAGNOSIS — Z1231 Encounter for screening mammogram for malignant neoplasm of breast: Secondary | ICD-10-CM

## 2020-09-12 ENCOUNTER — Other Ambulatory Visit (HOSPITAL_COMMUNITY)
Admission: RE | Admit: 2020-09-12 | Discharge: 2020-09-12 | Disposition: A | Discharge status: Home / Self Care | Payer: Medicare Other | Source: Ambulatory Visit | Attending: Gastroenterology | Admitting: Gastroenterology

## 2020-09-12 DIAGNOSIS — Z20822 Contact with and (suspected) exposure to covid-19: Secondary | ICD-10-CM | POA: Insufficient documentation

## 2020-09-12 DIAGNOSIS — Z01812 Encounter for preprocedural laboratory examination: Secondary | ICD-10-CM | POA: Insufficient documentation

## 2020-09-12 LAB — SARS CORONAVIRUS 2 (TAT 6-24 HRS): SARS Coronavirus 2: NEGATIVE

## 2020-09-13 DIAGNOSIS — R159 Full incontinence of feces: Secondary | ICD-10-CM

## 2020-09-13 DIAGNOSIS — K5902 Outlet dysfunction constipation: Secondary | ICD-10-CM

## 2020-09-14 ENCOUNTER — Encounter (HOSPITAL_COMMUNITY): Payer: Self-pay | Admitting: Gastroenterology

## 2020-09-14 NOTE — Anesthesia Preprocedure Evaluation (Addendum)
Anesthesia Evaluation  Patient identified by MRN, date of birth, ID band Patient awake    Reviewed: Allergy & Precautions, NPO status , Patient's Chart, lab work & pertinent test results  Airway Mallampati: II  TM Distance: >3 FB Neck ROM: Full    Dental  (+) Partial Lower, Caps, Dental Advisory Given   Pulmonary former smoker,    Pulmonary exam normal breath sounds clear to auscultation       Cardiovascular hypertension, Pt. on medications Normal cardiovascular exam Rhythm:Regular Rate:Normal  EKG 06/10/20 SB, prolonged QT   Neuro/Psych PSYCHIATRIC DISORDERS Depression negative neurological ROS     GI/Hepatic Neg liver ROS, GERD  Medicated and Controlled,Double duct sign Fecal incontinence   Endo/Other  Hyperlipidemia Oteoporosis  Renal/GU negative Renal ROS  negative genitourinary   Musculoskeletal   Abdominal   Peds  Hematology   Anesthesia Other Findings   Reproductive/Obstetrics                           Anesthesia Physical Anesthesia Plan  ASA: III  Anesthesia Plan: MAC   Post-op Pain Management:    Induction: Intravenous  PONV Risk Score and Plan: 2 and Propofol infusion and Treatment may vary due to age or medical condition  Airway Management Planned: Natural Airway and Nasal Cannula  Additional Equipment:   Intra-op Plan:   Post-operative Plan:   Informed Consent: I have reviewed the patients History and Physical, chart, labs and discussed the procedure including the risks, benefits and alternatives for the proposed anesthesia with the patient or authorized representative who has indicated his/her understanding and acceptance.     Dental advisory given  Plan Discussed with: CRNA and Anesthesiologist  Anesthesia Plan Comments:        Anesthesia Quick Evaluation

## 2020-09-14 NOTE — Progress Notes (Signed)
Patient denies shortness of breath, fever, cough or chest pain.  PCP - Dr Guerry Bruin Cardiologist - Dr Truett Mainland  Chest x-ray - 06/09/20 (1V) EKG - 06/10/20 Stress Test - 03/09/19 ECHO - 06/01/20 Cardiac Cath - n/a  Aspirin Instructions: Follow your surgeon's instructions on when to stop aspirin prior to surgery,  If no instructions were given by your surgeon then you will need to call the office for those instructions.  Anesthesia review: Yes  STOP now taking any Aspirin (unless otherwise instructed by your surgeon), Aleve, Naproxen, Ibuprofen, Motrin, Advil, Goody's, BC's, all herbal medications, fish oil, and all vitamins.   Coronavirus Screening Covid test on 09/12/20 was negative.  Patient verbalized understanding of instructions that were given via phone.

## 2020-09-15 ENCOUNTER — Ambulatory Visit (HOSPITAL_COMMUNITY): Payer: Medicare Other | Admitting: Physician Assistant

## 2020-09-15 ENCOUNTER — Encounter (HOSPITAL_COMMUNITY): Payer: Self-pay | Admitting: Gastroenterology

## 2020-09-15 ENCOUNTER — Ambulatory Visit (HOSPITAL_COMMUNITY)
Admission: RE | Admit: 2020-09-15 | Discharge: 2020-09-15 | Disposition: A | Discharge status: Home / Self Care | Payer: Medicare Other | Attending: Gastroenterology | Admitting: Gastroenterology

## 2020-09-15 ENCOUNTER — Encounter (HOSPITAL_COMMUNITY)
Admission: RE | Disposition: A | Discharge status: Home / Self Care | Payer: Self-pay | Source: Home / Self Care | Attending: Gastroenterology

## 2020-09-15 ENCOUNTER — Other Ambulatory Visit: Payer: Self-pay

## 2020-09-15 DIAGNOSIS — K2950 Unspecified chronic gastritis without bleeding: Secondary | ICD-10-CM | POA: Insufficient documentation

## 2020-09-15 DIAGNOSIS — K3189 Other diseases of stomach and duodenum: Secondary | ICD-10-CM

## 2020-09-15 DIAGNOSIS — K869 Disease of pancreas, unspecified: Secondary | ICD-10-CM

## 2020-09-15 DIAGNOSIS — M6289 Other specified disorders of muscle: Secondary | ICD-10-CM

## 2020-09-15 DIAGNOSIS — Z79899 Other long term (current) drug therapy: Secondary | ICD-10-CM | POA: Diagnosis not present

## 2020-09-15 DIAGNOSIS — K8689 Other specified diseases of pancreas: Secondary | ICD-10-CM | POA: Diagnosis not present

## 2020-09-15 DIAGNOSIS — K838 Other specified diseases of biliary tract: Secondary | ICD-10-CM | POA: Insufficient documentation

## 2020-09-15 DIAGNOSIS — Z9049 Acquired absence of other specified parts of digestive tract: Secondary | ICD-10-CM | POA: Diagnosis not present

## 2020-09-15 DIAGNOSIS — Z87891 Personal history of nicotine dependence: Secondary | ICD-10-CM | POA: Insufficient documentation

## 2020-09-15 DIAGNOSIS — K839 Disease of biliary tract, unspecified: Secondary | ICD-10-CM

## 2020-09-15 HISTORY — PX: ESOPHAGOGASTRODUODENOSCOPY (EGD) WITH PROPOFOL: SHX5813

## 2020-09-15 HISTORY — PX: BIOPSY: SHX5522

## 2020-09-15 HISTORY — PX: EUS: SHX5427

## 2020-09-15 SURGERY — UPPER ENDOSCOPIC ULTRASOUND (EUS) RADIAL
Anesthesia: Monitor Anesthesia Care

## 2020-09-15 MED ORDER — SODIUM CHLORIDE 0.9 % IV SOLN: INTRAVENOUS | Status: DC

## 2020-09-15 MED ORDER — PROPOFOL 500 MG/50ML IV EMUL
INTRAVENOUS | Status: DC | PRN
  Administered 2020-09-15: 150 ug/kg/min via INTRAVENOUS

## 2020-09-15 MED ORDER — LACTATED RINGERS IV SOLN: INTRAVENOUS | Status: DC | PRN

## 2020-09-15 MED ORDER — LIDOCAINE HCL (PF) 2 % IJ SOLN
INTRAMUSCULAR | Status: DC | PRN
  Administered 2020-09-15: 60 mg via INTRADERMAL

## 2020-09-15 MED ORDER — PROPOFOL 10 MG/ML IV BOLUS
INTRAVENOUS | Status: DC | PRN
  Administered 2020-09-15: 50 mg via INTRAVENOUS

## 2020-09-15 NOTE — Discharge Instructions (Signed)
YOU HAD AN ENDOSCOPIC PROCEDURE TODAY: Refer to the procedure report and other information in the discharge instructions given to you for any specific questions about what was found during the examination. If this information does not answer your questions, please call [place] office at [phone] to clarify.   YOU SHOULD EXPECT: Some feelings of bloating in the abdomen. Passage of more gas than usual. Walking can help get rid of the air that was put into your GI tract during the procedure and reduce the bloating. If you had a lower endoscopy (such as a colonoscopy or flexible sigmoidoscopy) you may notice spotting of blood in your stool or on the toilet paper. Some abdominal soreness may be present for a day or two, also.  DIET: Your first meal following the procedure should be a light meal and then it is ok to progress to your normal diet. A half-sandwich or bowl of soup is an example of a good first meal. Heavy or fried foods are harder to digest and may make you feel nauseous or bloated. Drink plenty of fluids but you should avoid alcoholic beverages for 24 hours. If you had a esophageal dilation, please see attached instructions for diet.    ACTIVITY: Your care partner should take you home directly after the procedure. You should plan to take it easy, moving slowly for the rest of the day. You can resume normal activity the day after the procedure however YOU SHOULD NOT DRIVE, use power tools, machinery or perform tasks that involve climbing or major physical exertion for 24 hours (because of the sedation medicines used during the test).   SYMPTOMS TO REPORT IMMEDIATELY: A gastroenterologist can be reached at any hour. Please call [phone]  for any of the following symptoms:   Following upper endoscopy (EGD, EUS, ERCP, esophageal dilation) Vomiting of blood or coffee ground material  New, significant abdominal pain  New, significant chest pain or pain under the shoulder blades  Painful or  persistently difficult swallowing  New shortness of breath  Black, tarry-looking or red, bloody stools  FOLLOW UP:  If any biopsies were taken you will be contacted by phone or by letter within the next 1-3 weeks. Call [phone]  if you have not heard about the biopsies in 3 weeks.  Please also call with any specific questions about appointments or follow up tests.  

## 2020-09-15 NOTE — Op Note (Signed)
[facility] Patient Name: Mary Leblanc Procedure Date : 09/15/2020 MRN: [id number] Attending MD: Justice Britain , MD Date of Birth: [birth year] CSN: [id number] Age: 80 Admit Type: Outpatient Procedure:                Upper EUS Indications:              Common bile duct dilation (etiology unknown) seen                            on CT scan, Dilated pancreatic duct on CT scan,                            Common bile duct dilation (acquired) seen on MRCP,                            Dilated pancreatic duct on MRCP Providers:                Justice Britain, MD, Kary Kos RN, RN, Cletis Athens, Technician Referring MD:             Gerrit Heck, MD, [place] Medicines:                Monitored Anesthesia Care Complications:            No immediate complications. Estimated Blood Loss:     Estimated blood loss was minimal. Procedure:                Pre-Anesthesia Assessment:                           - Prior to the procedure, a History and Physical                            was performed, and patient medications and                            allergies were reviewed. The patient's tolerance of                            previous anesthesia was also reviewed. The risks                            and benefits of the procedure and the sedation                            options and risks were discussed with the patient.                            All questions were answered, and informed consent                            was obtained. Prior Anticoagulants: The patient has  taken no previous anticoagulant or antiplatelet                            agents except for aspirin. ASA Grade Assessment:                            III - A patient with severe systemic disease. After                            reviewing the risks and benefits, the patient was                            deemed in satisfactory condition to  undergo the                            procedure.                           After obtaining informed consent, the endoscope was                            passed under direct vision. Throughout the                            procedure, the patient's blood pressure, pulse, and                            oxygen saturations were monitored continuously. The                            [id number]-H190 ([id number]) Olympus gastroscope was                            introduced through the mouth, and advanced to the                            second part of duodenum. The TJF- Q180V ([id number])                            Olympus duodenoscope was introduced through the                            mouth, and advanced to the second part of duodenum.                            The [id number] ([id number]) Olympus Linear EUS scope                            was introduced through the mouth, and advanced to                            the duodenum for ultrasound examination from the  stomach and duodenum. The upper EUS was                            accomplished without difficulty. The patient                            tolerated the procedure. Scope In: Scope Out: Findings:      ENDOSCOPIC FINDING: :      No gross lesions were noted in the entire esophagus.      The Z-line was regular and was found 40 cm from the incisors.      Patchy mildly erythematous mucosa without bleeding was found in the       entire examined stomach.      No other gross lesions were noted in the entire examined stomach.       Biopsies were taken with a cold forceps for histology and Helicobacter       pylori testing.      No gross lesions were noted in the duodenal bulb, in the first portion       of the duodenum and in the second portion of the duodenum.      The major papilla was normal.      ENDOSONOGRAPHIC FINDING: :      Pancreatic parenchymal abnormalities were noted in the entire pancreas.       These  consisted of hyperechoic strands.      The pancreatic duct had a dilated endosonographic appearance in the       pancreatic head (PD - 2.1 mm -> 3.8 mm), genu of the pancreas (PD - 3.6       mm -> 5.0 mm -> 3.0 mm), body of the pancreas (2.8 mm) and tail of the       pancreas (2.1 mm).      The pancreatic duct had a few prominent side branches on endosonographic       appearance in the body/tail region of the pancreas.      The pancreatic duct had a tortuous/ectatic appearance within the genu       transitioning into the body of the pancreas.      Endosonographic imaging in the entire pancreas showed no mass-lesion.      There was dilation in the common bile duct (9.9 mm) and in the common       hepatic duct (12.3 mm).      Endosonographic imaging in the common bile duct and in the common       hepatic duct showed no stones.      Endosonographic imaging of the ampulla showed no extrinsic compression,       intramural (subepithelial) lesion, mass, varices or wall thickening.      Endosonographic imaging in the visualized portion of the liver showed no       mass.      No malignant-appearing lymph nodes were visualized in the celiac region       (level 20), peripancreatic region and porta hepatis region.      The celiac region was visualized. Impression:               EGD Impression:                           - No gross lesions in esophagus. Z-line regular, 40  cm from the incisors.                           - Erythematous mucosa in the stomach. Biopsied.                           - No gross lesions in the duodenal bulb, in the                            first portion of the duodenum and in the second                            portion of the duodenum.                           - Normal major papilla.                           EUS Impression:                           - Pancreatic parenchymal abnormalities consisting                            of hyperechoic  strands were noted in the entire                            pancreas.                           - The pancreatic duct had a dilated endosonographic                            appearance in the pancreatic head, genu of the                            pancreas, body of the pancreas and tail of the                            pancreas (but as noted above tapering proximally                            was noted).                           - The pancreatic duct had prominent side-branches                            in the body/tail region of the pancreas.                           - The pancreatic duct had a tortuous/ectatic                            appearance within the genu transition to body of  the pancreas.                           - No pancreatic mass noted.                           - There was dilation in the common bile duct and in                            the common hepatic duct.                           - No ampullary mass noted.                           - No malignant-appearing lymph nodes were                            visualized in the celiac region (level 20),                            peripancreatic region and porta hepatis region. Recommendation:           - The patient will be observed post-procedure,                            until all discharge criteria are met.                           - Discharge patient to home.                           - Patient has a contact number available for                            emergencies. The signs and symptoms of potential                            delayed complications were discussed with the                            patient. Return to normal activities tomorrow.                            Written discharge instructions were provided to the                            patient.                           - Resume previous diet.                           - Observe patient's clinical course.                            - Consideration of  fecal elastase testing in future.                           - Recommend repeat imaging in 1-year if no other                            issues develop with MRI/MRCP to monitor and ensure                            overall stability at which point may not require                            additional workup/surveillance thereafter.                           - The findings and recommendations were discussed                            with the patient.                           - The findings and recommendations were discussed                            with the patient's family. Procedure Code(s):        --- Professional ---                           [phone], Esophagogastroduodenoscopy, flexible,                            transoral; with endoscopic ultrasound examination                            limited to the esophagus, stomach or duodenum, and                            adjacent structures                           43239, Esophagogastroduodenoscopy, flexible,                            transoral; with biopsy, single or multiple Diagnosis Code(s):        --- Professional ---                           K31.89, Other diseases of stomach and duodenum                           K86.9, Disease of pancreas, unspecified                           K83.8, Other specified diseases of biliary tract  I89.9, Noninfective disorder of lymphatic vessels                            and lymph nodes, unspecified                           K86.89, Other specified diseases of pancreas                           R93.3, Abnormal findings on diagnostic imaging of                            other parts of digestive tract                           R93.2, Abnormal findings on diagnostic imaging of                            liver and biliary tract CPT copyright 2019 American [employer] Association. All rights reserved. The codes documented in this report are  preliminary and upon coder review may  be revised to meet current compliance requirements. Justice Britain, MD 09/15/2020 8:48:40 AM Number of Addenda: 0

## 2020-09-15 NOTE — Transfer of Care (Addendum)
Immediate Anesthesia Transfer of Care Note  Patient: Mary Leblanc  Procedure(s) Performed: UPPER ENDOSCOPIC ULTRASOUND (EUS) RADIAL (N/A ) BIOPSY  Patient Location: PACU  Anesthesia Type:MAC  Level of Consciousness: drowsy  Airway & Oxygen Therapy: Patient Spontanous Breathing  Post-op Assessment: Report given to RN and Post -op Vital signs reviewed and stable  Post vital signs: Reviewed  Last Vitals:  Vitals Value Taken Time  BP 112/56   Temp    Pulse 56   Resp 12   SpO2 100     Last Pain:  Vitals:   09/15/20 0731  TempSrc: Oral  PainSc: 0-No pain         Complications: No complications documented.

## 2020-09-15 NOTE — H&P (Signed)
GASTROENTEROLOGY PROCEDURE H&P NOTE   Primary Care Physician: Tisovec, Adelfa Koh, MD  HPI: Mary Leblanc is a 80 y.o. female who presents for EGD/EUS for evaluation of double duct sign post cholecystectomy.  Past Medical History:  Diagnosis Date  . Anemia   . Depression   . Encounter for loop recorder check 02/14/2019  . GERD (gastroesophageal reflux disease)   . HTN (hypertension)   . Hyperlipidemia   . Osteopenia   . Syncope and collapse 01/22/2019   Past Surgical History:  Procedure Laterality Date  . ANAL RECTAL MANOMETRY N/A 08/15/2020   Procedure: ANO RECTAL MANOMETRY;  Surgeon: Shellia Cleverly, DO;  Location: [facility];  Service: Gastroenterology;  Laterality: N/A;  . APPENDECTOMY  2000  . CERVICAL LAMINOPLASTY  2002  . CHOLECYSTECTOMY  2013   Current Facility-Administered Medications  Medication Dose Route Frequency Provider Last Rate Last Admin  . 0.9 %  sodium chloride infusion   Intravenous Continuous Mansouraty, Netty Starring., MD       No Known Allergies Family History  Problem Relation Age of Onset  . Hypertension Mother   . Stroke Mother   . Thyroid cancer Mother   . Diabetes Mother   . Heart attack Father   . Prostate cancer Father   . Kidney cancer Father   . Clotting disorder Father   . Diabetes Father   . Non-Hodgkin's lymphoma Sister   . Diabetes Brother   . Heart disease Maternal Grandmother   . Liver cancer Maternal Grandfather        not sure if it was liver or pancreatic cancer  . Heart disease Paternal Grandmother   . Heart Problems Paternal Grandfather 75  . Colon cancer Neg Hx   . Esophageal cancer Neg Hx   . Pancreatic cancer Neg Hx    Social History   Socioeconomic History  . Marital status: Married    Spouse name: Not on file  . Number of children: 2  . Years of education: Not on file  . Highest education level: Not on file  Occupational History  . Occupation: retired Charity fundraiser  Tobacco Use  . Smoking status: Former Smoker     Packs/day: 1.00    Years: 10.00    Pack years: 10.00    Types: Cigarettes    Quit date: 1968    Years since quitting: 54.3  . Smokeless tobacco: Never Used  Vaping Use  . Vaping Use: Never used  Substance and Sexual Activity  . Alcohol use: Not Currently    Comment: occ-.5 per day. stopped drinking 3/17  . Drug use: Not Currently  . Sexual activity: Not on file  Other Topics Concern  . Not on file  Social History Narrative  . Not on file   Social Determinants of Health   Financial Resource Strain: Not on file  Food Insecurity: Not on file  Transportation Needs: Not on file  Physical Activity: Not on file  Stress: Not on file  Social Connections: Not on file  Intimate Partner Violence: Not on file    Physical Exam: Vital signs in last 24 hours: Resp:  [15] 15 (05/12 0731) BP: (118)/(61) 118/61 (05/12 0731) SpO2:  [100 %] 100 % (05/12 0731) Weight:  [49.9 kg] 49.9 kg (05/11 1902)   GEN: NAD EYE: Sclerae anicteric ENT: MMM CV: Non-tachycardic GI: Soft, NT/ND NEURO:  Alert & Oriented x 3  Lab Results: No results for input(s): WBC, HGB, HCT, PLT in the last 72 hours. BMET  No results for input(s): NA, K, CL, CO2, GLUCOSE, BUN, CREATININE, CALCIUM in the last 72 hours. LFT No results for input(s): PROT, ALBUMIN, AST, ALT, ALKPHOS, BILITOT, BILIDIR, IBILI in the last 72 hours. PT/INR No results for input(s): LABPROT, INR in the last 72 hours.   Impression / Plan: This is a 80 y.o.female who presents for EGD/EUS for evaluation of double duct sign post cholecystectomy.  The risks of an EUS including intestinal perforation, bleeding, infection, aspiration, and medication effects were discussed as was the possibility it may not give a definitive diagnosis if a biopsy is performed.  When a biopsy of the pancreas is done as part of the EUS, there is an additional risk of pancreatitis at the rate of about 1-2%.  It was explained that procedure related pancreatitis is  typically mild, although it can be severe and even life threatening, which is why we do not perform random pancreatic biopsies and only biopsy a lesion/area we feel is concerning enough to warrant the risk.  The risks and benefits of endoscopic evaluation were discussed with the patient; these include but are not limited to the risk of perforation, infection, bleeding, missed lesions, lack of diagnosis, severe illness requiring hospitalization, as well as anesthesia and sedation related illnesses.  The patient is agreeable to proceed.    Corliss Parish, MD [facility] Office # [phone]

## 2020-09-15 NOTE — Anesthesia Postprocedure Evaluation (Signed)
Anesthesia Post Note  Patient: Mary Leblanc  Procedure(s) Performed: UPPER ENDOSCOPIC ULTRASOUND (EUS) RADIAL (N/A ) BIOPSY     Patient location during evaluation: PACU Anesthesia Type: MAC Level of consciousness: awake and alert Pain management: pain level controlled Vital Signs Assessment: post-procedure vital signs reviewed and stable Respiratory status: spontaneous breathing, nonlabored ventilation and respiratory function stable Cardiovascular status: stable and blood pressure returned to baseline Postop Assessment: no apparent nausea or vomiting Anesthetic complications: no   No complications documented.  Last Vitals:  Vitals:   09/15/20 0832 09/15/20 0842  BP: (!) 115/57 (!) 111/51  Pulse: 71 64  Resp: 10 10  Temp: 36.6 C   SpO2: 100% 100%    Last Pain:  Vitals:   09/15/20 0842  TempSrc:   PainSc: 0-No pain                 Chisum Habenicht A.

## 2020-09-16 LAB — SURGICAL PATHOLOGY

## 2020-09-18 ENCOUNTER — Encounter (HOSPITAL_COMMUNITY): Payer: Self-pay | Admitting: Gastroenterology

## 2020-09-19 ENCOUNTER — Encounter: Payer: Self-pay | Admitting: Gastroenterology

## 2020-09-22 ENCOUNTER — Other Ambulatory Visit: Payer: Self-pay

## 2020-09-22 ENCOUNTER — Other Ambulatory Visit (HOSPITAL_BASED_OUTPATIENT_CLINIC_OR_DEPARTMENT_OTHER): Payer: Self-pay

## 2020-09-22 ENCOUNTER — Ambulatory Visit: Payer: Medicare Other | Attending: Internal Medicine

## 2020-09-22 DIAGNOSIS — Z23 Encounter for immunization: Secondary | ICD-10-CM

## 2020-09-22 MED ORDER — PFIZER-BIONT COVID-19 VAC-TRIS 30 MCG/0.3ML IM SUSP
INTRAMUSCULAR | 0 refills | Status: DC
  Filled 2020-09-22: qty 0.3, 1d supply, fill #0

## 2020-09-22 NOTE — Progress Notes (Signed)
   Covid-19 Vaccination Clinic  Name:  Mary Leblanc    MRN: [id number] DOB: 1941/03/09  09/22/2020  Mary Leblanc was observed post Covid-19 immunization for 15 minutes without incident. She was provided with Vaccine Information Sheet and instruction to access the V-Safe system.   Mary Leblanc was instructed to call 911 with any severe reactions post vaccine: Marland Kitchen Difficulty breathing  . Swelling of face and throat  . A fast heartbeat  . A bad rash all over body  . Dizziness and weakness   Immunizations Administered    Name Date Dose VIS Date Route   PFIZER Comrnaty(Gray TOP) Covid-19 Vaccine 09/22/2020  2:03 PM 0.3 mL 04/14/2020 Intramuscular   Manufacturer: [employer], [employer]   Lot: [id number]   NDC: [phone]

## 2020-10-03 ENCOUNTER — Other Ambulatory Visit: Payer: Self-pay | Admitting: Cardiology

## 2020-10-03 DIAGNOSIS — I10 Essential (primary) hypertension: Secondary | ICD-10-CM

## 2020-10-24 ENCOUNTER — Telehealth: Payer: Self-pay

## 2020-10-24 NOTE — Telephone Encounter (Signed)
Thank you. BP looks okay. If she wants, I can see her this afternoon, otherwise keep 6/22 appt.  Thanks MJP

## 2020-10-24 NOTE — Telephone Encounter (Signed)
Spoke to patient she said she would like to keep her Wednesday appt

## 2020-10-26 ENCOUNTER — Ambulatory Visit: Payer: Medicare Other | Admitting: Cardiology

## 2020-10-26 NOTE — Progress Notes (Signed)
Patient referred by Haywood Pao, MD for syncope  Subjective:   Mary Leblanc, female    DOB: [birth year], 80 y.o.   MRN: [id number]   Chief Complaint  Patient presents with   Bradycardia   Follow-up   Dizziness   Shortness of Breath   80 y.o. Caucasian female with hypertension, syncope, hyponatremia.   Patient presents for urgent visit with concern for low heart rate and low blood pressure as well as episodes of lightheadedness starting 2 weeks ago.  Patient presented to her PCP last week for similar complaints, and was advised to discontinue amlodipine.  However patient's symptoms have continued.  Denies chest pain, dyspnea, syncope, near syncope.  She states she drinks about 30 ounces of water as well as 30 ounces of coffee per day.  She typically walks 3 to 5 miles per day.  However due to fatigue she has not been doing this frequently.  Yesterday patient did walk 5 miles, but states she was quite fatigued afterwards.   Current Outpatient Medications on File Prior to Visit  Medication Sig Dispense Refill   alendronate (FOSAMAX) 70 MG tablet Take 70 mg by mouth once a week. Take with a full glass of water on an empty stomach.     aspirin EC 81 MG tablet Take 81 mg by mouth at bedtime.     B Complex-C (B-COMPLEX WITH VITAMIN C) tablet Take 1 tablet by mouth daily.     calcium carbonate (OSCAL) 1500 (600 Ca) MG TABS tablet Take 600 mg of elemental calcium by mouth daily with breakfast.     COVID-19 mRNA Vac-TriS, Pfizer, (PFIZER-BIONT COVID-19 VAC-TRIS) SUSP injection Inject into the muscle. 0.3 mL 0   escitalopram (LEXAPRO) 10 MG tablet Take 10 mg by mouth daily.     gabapentin (NEURONTIN) 300 MG capsule Take 600 mg by mouth 3 (three) times daily.      loperamide (IMODIUM) 2 MG capsule Take 4 mg by mouth at bedtime.     Methylcellulose, Laxative, (CITRUCEL PO) Take 2 tablets by mouth in the morning and at bedtime.     omeprazole (PRILOSEC) 20 MG capsule Take 20 mg by mouth  daily.     rosuvastatin (CRESTOR) 10 MG tablet TAKE 1 TABLET BY MOUTH EVERY DAY (Patient taking differently: Take 10 mg by mouth daily.) 90 tablet 1   No current facility-administered medications on file prior to visit.    Cardiovascular studies:  10/27/2020:  Marked sinus bradycardia at a rate of 49 bpm.  Normal axis.  Incomplete right bundle branch block.  Nonspecific T wave abnormality.  No evidence of ischemia or underlying injury pattern.  Scheduled Remote loop recorder check 06/07/2020:  The presenting rhythm is sinus arrhythmia. No symptomatic or automatic episodes were triggered. Daily activity trends are stable. Heart rate variability trends are stable. No heart block.  Continue remote monitoring.  Echocardiogram 06/01/2020:  Left ventricle cavity is normal in size and wall thickness. Normal global  wall motion. Normal LV systolic function with EF 66%. Doppler evidence of  grade I (impaired) diastolic dysfunction, normal LAP.  Mild tricuspid regurgitation.  No evidence of pulmonary hypertension.  EKG 05/27/2020: Sinus rhytm 54 bpm Normal EKG  Exercise tetrofosmin stress test  03/09/2019: Normal ECG stress. exercised for a total of 6 minutes and 0 seconds, achieving approximately 7.05 METs. Baseline heart rate was measured at 62 bpm. A maximum heart rate of 128 beats per minute was achieved, which is 90% of maximum predicted heart  rate.  During exercise the patient developed  dyspnea, dizziness, & chest tightness 5/10. Exercise was terminated due to target heart rate achieved.  The calculated Duke Treadmill Score is 2.00 (Moderate risk) Normal myocardial perfusion. Stress LV EF: 62%. Low risk study.  No previous exam available for comparison.  Recent labs: 06/24/2020: Glucose 89, BUN/Cr 22/1.01. EGFR 53. Na/K 128/4.5. AlKP 32. Rest of the CMP normal  06/09/2020: Glucose 173, BUN/Cr 24/1.27. EGFR 43. Na/K 136/4.8. AlKP 34. Rest of the CMP normal  2020: Cholesterol 202,  triglycerides 236, HDL 57, LDL 98. Apolipoprotein B 107 (<90)   Review of Systems  Constitutional: Positive for malaise/fatigue.  Cardiovascular:  Negative for chest pain, dyspnea on exertion, leg swelling, palpitations and syncope.  Neurological:  Positive for light-headedness.       Vitals:   10/27/20 0851 10/27/20 0901  BP:  110/62  Temp:    SpO2: 98%    Orthostatic VS for the past 72 hrs (Last 3 readings):  Orthostatic BP Patient Position BP Location Cuff Size Orthostatic Pulse  10/27/20 0901 -- Sitting Left Arm Normal --  10/27/20 0851 107/59 Sitting Left Arm Normal 51  10/27/20 0850 102/53 Sitting Left Arm Normal (!) 49  10/27/20 0842 108/52 Sitting Left Arm Normal (!) 49     Objective:   Physical Exam Vitals and nursing note reviewed.  Constitutional:      General: She is not in acute distress. Neck:     Vascular: No JVD.  Cardiovascular:     Rate and Rhythm: Regular rhythm. Bradycardia present.     Pulses: Intact distal pulses.     Heart sounds: Normal heart sounds, S1 normal and S2 normal. No murmur heard.   No gallop.  Pulmonary:     Effort: Pulmonary effort is normal. No respiratory distress.     Breath sounds: Normal breath sounds. No wheezing, rhonchi or rales.  Musculoskeletal:     Right lower leg: No edema.     Left lower leg: No edema.  Neurological:     Mental Status: She is alert.    Orthostatic VS for the past 72 hrs (Last 3 readings):  Orthostatic BP Patient Position BP Location Cuff Size Orthostatic Pulse  10/27/20 0901 -- Sitting Left Arm Normal --  10/27/20 0851 107/59 Sitting Left Arm Normal 51  10/27/20 0850 102/53 Sitting Left Arm Normal (!) 49  10/27/20 0842 108/52 Sitting Left Arm Normal (!) 49      Assessment & Recommendations:   80 y.o. Caucasian female with hypertension, syncope, hyponatremia  Bradycardia:  Office visit patient walked in the hallway and heart rate increased appropriately demonstrating chronotropic competence.   Patient has a history of bradycardia which is typically asymptomatic.  Agree with discontinuation of amlodipine by PCP.  We will continue to monitor closely.  However feel patient's symptoms of lightheadedness and fatigue may be related to soft blood pressure rather than underlying bradycardia.  Primary hypertension: Patient is not orthostatic today, however her blood pressure is quite soft.  Review of patient's history shows that her blood pressure typically runs soft, however it is lower today than typical.  Suspect hypotension to be contributing to patient's symptoms of fatigue and lightheadedness.  We will therefore discontinue valsartan.  Suspect patient's symptoms may improve.  However given bradycardia will follow closely.  Syncope: Likely vasovagal syncope, with trigger being acute abdominal pain and large bowel movement. No recurrence.   Mixed hyperlipidemia: Continue rosuvastatin 10 mg  Follow-up in 2 weeks, sooner if needed,  for hypertension and bradycardia.   Alethia Berthold, PA-C 10/28/2020, 2:57 PM Office: [phone]

## 2020-10-27 ENCOUNTER — Ambulatory Visit: Payer: Medicare Other | Admitting: Student

## 2020-10-27 ENCOUNTER — Other Ambulatory Visit: Payer: Self-pay

## 2020-10-27 ENCOUNTER — Encounter: Payer: Self-pay | Admitting: Student

## 2020-10-27 VITALS — BP 110/62 | Temp 98.0°F | Ht 60.0 in | Wt 113.0 lb

## 2020-10-27 DIAGNOSIS — R001 Bradycardia, unspecified: Secondary | ICD-10-CM

## 2020-10-27 DIAGNOSIS — I10 Essential (primary) hypertension: Secondary | ICD-10-CM

## 2020-10-27 DIAGNOSIS — R42 Dizziness and giddiness: Secondary | ICD-10-CM

## 2020-10-28 NOTE — Telephone Encounter (Signed)
From patient.

## 2020-10-29 ENCOUNTER — Other Ambulatory Visit: Payer: Self-pay | Admitting: Cardiology

## 2020-10-29 DIAGNOSIS — I10 Essential (primary) hypertension: Secondary | ICD-10-CM

## 2020-10-31 ENCOUNTER — Telehealth: Payer: Self-pay

## 2020-10-31 NOTE — Telephone Encounter (Signed)
Patient stated that her right eye was fluttering and is asking if that was normal. I did ask her if she thought it was after she took a medication, she said no. She said that she just wants to make sure one of her medications are not causing this. Please advise.

## 2020-11-03 ENCOUNTER — Ambulatory Visit
Admission: RE | Admit: 2020-11-03 | Discharge: 2020-11-03 | Disposition: A | Discharge status: Home / Self Care | Payer: Medicare Other | Source: Ambulatory Visit

## 2020-11-03 ENCOUNTER — Other Ambulatory Visit: Payer: Self-pay

## 2020-11-03 DIAGNOSIS — Z1231 Encounter for screening mammogram for malignant neoplasm of breast: Secondary | ICD-10-CM

## 2020-11-03 NOTE — Telephone Encounter (Signed)
CC saw her most recently, but dont think any new medication was started. Lack of sleep, fatigue could cause these symptoms, I would wait for a day or so. If not better, recommend checking with Dr. Deneen Harts office.  Thanks MJP

## 2020-11-10 ENCOUNTER — Other Ambulatory Visit: Payer: Self-pay

## 2020-11-10 ENCOUNTER — Ambulatory Visit: Payer: Medicare Other | Admitting: Student

## 2020-11-10 ENCOUNTER — Encounter: Payer: Self-pay | Admitting: Student

## 2020-11-10 VITALS — BP 114/59 | HR 64 | Temp 98.6°F | Resp 16 | Ht 60.0 in | Wt 113.0 lb

## 2020-11-10 DIAGNOSIS — R001 Bradycardia, unspecified: Secondary | ICD-10-CM

## 2020-11-10 DIAGNOSIS — R42 Dizziness and giddiness: Secondary | ICD-10-CM

## 2020-11-10 DIAGNOSIS — I10 Essential (primary) hypertension: Secondary | ICD-10-CM

## 2020-11-10 NOTE — Progress Notes (Signed)
Patient referred by Haywood Pao, MD for syncope  Subjective:   Mary Leblanc, female    DOB: Jan 09, 1941, 80 y.o.   MRN: [id number]   Chief Complaint  Patient presents with   Bradycardia   Hypotension   Follow-up    2 weeks   80 y.o. Caucasian female with hypertension, syncope, hyponatremia.   Patient presents for 2-week follow-up of hypertension and bradycardia.  Last visit patient had recently been seen by PCP who discontinued amlodipine due to bradycardia and hypotension, and then our office patient's blood pressure remained soft, therefore discontinued valsartan.    Patient continues to complain of fatigue which has not improved since last visit as well as intermittent brief episodes of lightheadedness without identifiable triggers.  Denies chest pain, dyspnea, syncope.   Current Outpatient Medications on File Prior to Visit  Medication Sig Dispense Refill   alendronate (FOSAMAX) 70 MG tablet Take 70 mg by mouth once a week. Take with a full glass of water on an empty stomach.     aspirin EC 81 MG tablet Take 81 mg by mouth at bedtime.     B Complex-C (B-COMPLEX WITH VITAMIN C) tablet Take 1 tablet by mouth daily.     calcium carbonate (OSCAL) 1500 (600 Ca) MG TABS tablet Take 600 mg of elemental calcium by mouth daily with breakfast.     escitalopram (LEXAPRO) 10 MG tablet Take 10 mg by mouth daily.     loperamide (IMODIUM) 2 MG capsule Take 8 mg by mouth at bedtime.     melatonin 1 MG TABS tablet Take 1 mg by mouth at bedtime.     Methylcellulose, Laxative, (CITRUCEL PO) Take 2 tablets by mouth in the morning and at bedtime.     omeprazole (PRILOSEC) 20 MG capsule Take 20 mg by mouth daily.     rosuvastatin (CRESTOR) 10 MG tablet TAKE 1 TABLET BY MOUTH EVERY DAY (Patient taking differently: Take 10 mg by mouth daily.) 90 tablet 1   gabapentin (NEURONTIN) 300 MG capsule Take 600 mg by mouth 2 (two) times daily.     No current facility-administered medications on file  prior to visit.    Cardiovascular studies:  EKG 10/27/2020:  Marked sinus bradycardia at a rate of 49 bpm.  Normal axis.  Incomplete right bundle branch block.  Nonspecific T wave abnormality.  No evidence of ischemia or underlying injury pattern.  Scheduled Remote loop recorder check 10/03/2020:  The presenting rhythm is sinus arrhythmia. No symptomatic or automatic episodes were triggered. Daily activity trends are stable. Heart rate variability trends are stable. No heart block. Continue remote monitoring.  Scheduled Remote loop recorder check 06/07/2020:  The presenting rhythm is sinus arrhythmia. No symptomatic or automatic episodes were triggered. Daily activity trends are stable. Heart rate variability trends are stable. No heart block.  Continue remote monitoring.  Echocardiogram 06/01/2020:  Left ventricle cavity is normal in size and wall thickness. Normal global  wall motion. Normal LV systolic function with EF 66%. Doppler evidence of  grade I (impaired) diastolic dysfunction, normal LAP.  Mild tricuspid regurgitation.  No evidence of pulmonary hypertension.  EKG 05/27/2020: Sinus rhytm 54 bpm Normal EKG  Exercise tetrofosmin stress test  03/09/2019: Normal ECG stress. exercised for a total of 6 minutes and 0 seconds, achieving approximately 7.05 METs. Baseline heart rate was measured at 62 bpm. A maximum heart rate of 128 beats per minute was achieved, which is 90% of maximum predicted heart rate.  During  exercise the patient developed  dyspnea, dizziness, & chest tightness 5/10. Exercise was terminated due to target heart rate achieved.  The calculated Duke Treadmill Score is 2.00 (Moderate risk) Normal myocardial perfusion. Stress LV EF: 62%. Low risk study.  No previous exam available for comparison.  Recent labs: CMP Latest Ref Rng & Units 06/24/2020 06/09/2020 05/30/2020  Glucose 70 - 99 mg/dL 89 173(H) 107(H)  BUN 6 - 23 mg/dL 22 24(H) 16  Creatinine 0.40 - 1.20 mg/dL  1.01 1.27(H) 0.89  Sodium 135 - 145 mEq/L 128(L) 136 142  Potassium 3.5 - 5.1 mEq/L 4.5 4.8 5.5(H)  Chloride 96 - 112 mEq/L 93(L) 101 101  CO2 19 - 32 mEq/L '30 23 27  ' Calcium 8.4 - 10.5 mg/dL 9.9 9.8 10.4(H)  Total Protein 6.0 - 8.3 g/dL 7.3 6.6 -  Total Bilirubin 0.2 - 1.2 mg/dL 0.8 0.7 -  Alkaline Phos 39 - 117 U/L 32(L) 34(L) -  AST 0 - 37 U/L 27 34 -  ALT 0 - 35 U/L 21 29 -   CBC Latest Ref Rng & Units 06/24/2020 06/09/2020  WBC 4.0 - 10.5 K/uL 4.5 5.0  Hemoglobin 12.0 - 15.0 g/dL 12.4 12.2  Hematocrit 36.0 - 46.0 % 35.9(L) 36.0  Platelets 150.0 - 400.0 K/uL 180.0 165   06/24/2020: Glucose 89, BUN/Cr 22/1.01. EGFR 53. Na/K 128/4.5. AlKP 32. Rest of the CMP normal  06/09/2020: Glucose 173, BUN/Cr 24/1.27. EGFR 43. Na/K 136/4.8. AlKP 34. Rest of the CMP normal  2020: Cholesterol 202, triglycerides 236, HDL 57, LDL 98. Apolipoprotein B 107 (<90)   Review of Systems  Constitutional: Positive for malaise/fatigue.  Cardiovascular:  Negative for chest pain, dyspnea on exertion, leg swelling, orthopnea, palpitations and syncope.  Neurological:  Positive for light-headedness.       Vitals:   11/10/20 1009  BP: (!) 114/59  Pulse: 64  Resp: 16  Temp: 98.6 F (37 C)  SpO2: 97%       Objective:   Physical Exam Vitals and nursing note reviewed.  Constitutional:      General: She is not in acute distress. Neck:     Vascular: No JVD.  Cardiovascular:     Rate and Rhythm: Normal rate and regular rhythm.     Pulses: Intact distal pulses.     Heart sounds: Normal heart sounds, S1 normal and S2 normal. No murmur heard.   No gallop.  Pulmonary:     Effort: Pulmonary effort is normal.     Breath sounds: Normal breath sounds.  Musculoskeletal:     Right lower leg: No edema.     Left lower leg: No edema.  Neurological:     Mental Status: She is alert.       Assessment & Recommendations:   80 y.o. Caucasian female with hypertension, syncope, hyponatremia  Bradycardia:   Review of loop recorder transmission from May 2022 revealed no significant bradycardia arrhythmias but rather sinus arrhythmia. During last office visit patient demonstrated chronotropic competence while walking in the hallway.  However given continued symptoms of fatigue will obtain exercise treadmill stress test in order to evaluate chronotropic competency further. Review of loop recorder data for heart rate revealed daytime average heart rate approximately 55 bpm and nighttime average heart rate approximately 50 bpm.   Primary hypertension: Patient's blood pressure is under excellent control, do not feel she needs antihypertensive medications at this time.  Patient will remain off of amlodipine and valsartan.  Syncope: Likely vasovagal syncope, with trigger being  acute abdominal pain and large bowel movement. No recurrence.   Mixed hyperlipidemia: Continue rosuvastatin 10 mg  Follow-up as previously scheduled with Dr. Virgina Jock in September.  Patient was seen in collaboration with Dr. Virgina Jock. He also reviewed patient's chart and examined the patient. Dr. Virgina Jock is in agreement of the plan.     Alethia Berthold, PA-C 11/10/2020, 12:50 PM Office: [phone]

## 2020-11-10 NOTE — Telephone Encounter (Signed)
Patient came in today. She has been seen by [facility] today.

## 2020-11-14 ENCOUNTER — Telehealth: Payer: Self-pay

## 2020-11-14 NOTE — Telephone Encounter (Signed)
Please advise her to continue to monitor her blood pressure and notify our office if in 1 week it remains >140/80 mmHg. Currently there is no cardiovascular explanation for her fatigue. Will await results of stress test.

## 2020-11-14 NOTE — Telephone Encounter (Signed)
Spoke to patient she voiced understanding

## 2020-12-05 ENCOUNTER — Encounter: Payer: Self-pay | Admitting: Physical Therapy

## 2020-12-05 ENCOUNTER — Ambulatory Visit: Payer: Medicare Other | Attending: Gastroenterology | Admitting: Physical Therapy

## 2020-12-05 ENCOUNTER — Other Ambulatory Visit: Payer: Self-pay

## 2020-12-05 DIAGNOSIS — R279 Unspecified lack of coordination: Secondary | ICD-10-CM | POA: Insufficient documentation

## 2020-12-05 DIAGNOSIS — M6281 Muscle weakness (generalized): Secondary | ICD-10-CM | POA: Insufficient documentation

## 2020-12-05 NOTE — Patient Instructions (Signed)
Access Code: [email or web address] URL: [email or web address] Date: 12/05/2020 Prepared by: Dwana Curd  Exercises Supine Pelvic Floor Stretch - 1 x daily - 7 x weekly - 1 sets - 3 reps - 30 sec hold Supine Hamstring Stretch with Strap - 1 x daily - 7 x weekly - 1 sets - 3 reps - 30 sec hold Seated Hamstring Stretch - 1 x daily - 7 x weekly - 1 sets - 3 reps - 30 sec hold Supine Figure 4 Piriformis Stretch - 1 x daily - 7 x weekly - 1 sets - 3 reps - 30 sec hold

## 2020-12-05 NOTE — Therapy (Signed)
[facility] [address] [place], [place], [address] Phone: [phone]   Fax:  [phone]  Physical Therapy Evaluation  Patient Details  Name: Mary Leblanc MRN: [id number] Date of Birth: 12/23/1940 Referring Provider (PT): Shellia Cleverly, [place]   Encounter Date: 12/05/2020   PT End of Session - 12/05/20 1444     Visit Number 1    Date for PT Re-Evaluation 02/27/21    Authorization Type Medicare A/B    PT Start Time 1430    PT Stop Time 1515    PT Time Calculation (min) 45 min    Activity Tolerance Patient tolerated treatment well    Behavior During Therapy [facility] for tasks assessed/performed             Past Medical History:  Diagnosis Date   Anemia    Depression    Encounter for loop recorder check 02/14/2019   GERD (gastroesophageal reflux disease)    HTN (hypertension)    Hyperlipidemia    Osteopenia    Syncope and collapse 01/22/2019    Past Surgical History:  Procedure Laterality Date   ANAL RECTAL MANOMETRY N/A 08/15/2020   Procedure: ANO RECTAL MANOMETRY;  Surgeon: Shellia Cleverly, DO;  Location: [facility] ENDOSCOPY;  Service: Gastroenterology;  Laterality: N/A;   APPENDECTOMY  2000   BIOPSY  09/15/2020   Procedure: BIOPSY;  Surgeon: Lemar Lofty., MD;  Location: [facility] ENDOSCOPY;  Service: Gastroenterology;;   CERVICAL LAMINOPLASTY  2002   CHOLECYSTECTOMY  2013   ESOPHAGOGASTRODUODENOSCOPY (EGD) WITH PROPOFOL N/A 09/15/2020   Procedure: ESOPHAGOGASTRODUODENOSCOPY (EGD) WITH PROPOFOL;  Surgeon: Lemar Lofty., MD;  Location: [facility] ENDOSCOPY;  Service: Gastroenterology;  Laterality: N/A;   EUS N/A 09/15/2020   Procedure: UPPER ENDOSCOPIC ULTRASOUND (EUS) RADIAL;  Surgeon: Lemar Lofty., MD;  Location: [facility] ENDOSCOPY;  Service: Gastroenterology;  Laterality: N/A;    There were no vitals filed for this visit.    Subjective Assessment - 12/05/20 1430     Subjective Pt states she  has had issues for years and can lose control of her bowels and has syncope and can become unconscious because of pain that occurs suddenly.  Pt is on low FODMAP diet and that helps but not completely resolves the issues.  I wear depends for certain situations.  Pt states she is fearful of eating the wrong thing now.  I think some of it could be related to vaginal delivery with bad tearing that she believes that started this issue a long time ago. I am sometimes constipated sometimes multiple BMs at night    Pertinent History sinus bradycardia, syncope, history of 4th degree tear    Patient Stated Goals see if there is anything that can help my digestion and be able to empty completely    Currently in Pain? Yes    Pain Score 8     Pain Location Abdomen    Pain Orientation Lower    Pain Descriptors / Indicators Discomfort   griping   Pain Type Chronic pain    Pain Radiating Towards across the lower abdomen    Pain Onset 1 to 4 weeks ago    Pain Frequency Intermittent    Aggravating Factors  I think food    Pain Relieving Factors drink hot tea    Multiple Pain Sites No                [facility] PT Assessment - 12/05/20 0001  Assessment   Medical Diagnosis M62.89 (ICD-10-CM) - Pelvic floor dysfunction    Referring Provider (PT) Cirigliano, Vito V, DO    Onset Date/Surgical Date --   many years getting worse   Prior Therapy No      Precautions   Precautions None      Balance Screen   Has the patient fallen in the past 6 months Yes    How many times? passed out 1x    Has the patient had a decrease in activity level because of a fear of falling?  No    Is the patient reluctant to leave their home because of a fear of falling?  No      Home [employer] manag[employer]   Living Environment Private residence    Living Arrangements Spouse/significant other      Prior Function   Level of Independence Independent    Vocation Retired      IT consultantCognition   Overall Cognitive Status Within Functional  Limits for tasks assessed      Functional Tests   Functional tests Single leg stance      Single Leg Stance   Comments unsteady and mild trendelenburg Rt LE      Posture/Postural Control   Posture/Postural Control Postural limitations    Postural Limitations Anterior pelvic tilt;Increased lumbar lordosis   torso shifted back     ROM / Strength   AROM / PROM / Strength Strength;PROM;AROM      AROM   Overall AROM Comments lumbar flexion 50%      PROM   Overall PROM Comments Rt hip ER 50%; Lt hip ER 60%      Strength   Overall Strength Comments hip abd/add 5/5, LE grossly 5/5      Flexibility   Soft Tissue Assessment /Muscle Length yes    Hamstrings 80%      Palpation   Palpation comment glutes, h/s lumbar and thoracic erectors tight      Ambulation/Gait   Gait Pattern Within Functional Limits                        Objective measurements completed on examination: See above findings.     Pelvic Floor Special Questions - 12/05/20 0001     Prior Pregnancies Yes    Number of Pregnancies 3   1 miscarriage   Number of Vaginal Deliveries 2    Any difficulty with labor and deliveries Yes    Episiotomy Performed --   4th degree tear   Currently Sexually Active No    Marinoff Scale pain prevents any attempts at intercourse    Urinary Leakage No    Urinary frequency dysuria    Fecal incontinence Yes    Falling out feeling (prolapse) Yes    Pelvic Floor Internal Exam pt identity confirmed and informed consent given to perform    Exam Type Rectal    Palpation low tone then unable to relax after contracting    Strength weak squeeze, no lift    Strength # of reps 1    Strength # of seconds 2    Tone low                      PT Education - 12/05/20 1700     Education Details Access Code: [email or web address]    Person(s) Educated Patient    Methods Explanation;Demonstration;Tactile cues;Verbal cues;Handout    Comprehension Verbalized  understanding;Returned demonstration  PT Short Term Goals - 12/05/20 1700       PT SHORT TERM GOAL #1   Title Pt will report feelingmore complete BMs    Time 4    Period Weeks    Status New    Target Date 01/02/21      PT SHORT TERM GOAL #2   Title Pt will report ind with initial HEP and toileting techniques    Time 4    Period Weeks    Status New    Target Date 01/02/21               PT Long Term Goals - 12/05/20 1445       PT LONG TERM GOAL #1   Title Pt will be able to make a week without any smearing or leakage    Time 12    Period Weeks    Status New    Target Date 02/27/21      PT LONG TERM GOAL #2   Title Pt will be able to completely empty bowels at one time without waking throughout the night to have a BM    Time 12    Period Weeks    Status New    Target Date 02/27/21      PT LONG TERM GOAL #3   Title ind with advanced HEP    Time 12    Period Weeks    Status New    Target Date 02/27/21      PT LONG TERM GOAL #4   Title Ind with vaginal release and relaxation techniques including dilators as needed for less pain during intercourse    Time 12    Period Weeks    Status New    Target Date 02/27/21                    Plan - 12/05/20 1528     Clinical Impression Statement Pt presents to clinic today due to fecal incontinence that has been ongoing for many years.  She also has dysuria at times taking longer amount of time to void.  Pt demonstrates tension throughout posterior chain including lumbar and thoracic erectors, glutes and hamstrings.  Pt has reduced hip ER Rt>Lt.  Pt has 5/5 MMT hip abdcution and adduction.  Pt has low tone of posterior pelvic floor with 2/5 MMT lasting for 2 seconds and after initially contracting there is minimal relaxing after she contracts the pelvic floor.  She has pain with vaginal penetration which was not assessed today due to time but Ramond Marrow is 3/3.  Pt will benefit from skilled PT to  address these impairments for improved bowel and bladder function as well as reduced pain in abdomen and during intercourse.    Personal Factors and Comorbidities Comorbidity 3+    Comorbidities IBS, vaginal deliveries with significant tearing, cholecystectomy, appendectomy, cervical laminectomy    Examination-Activity Limitations Toileting;Continence    Examination-Participation Restrictions Community Activity;Interpersonal Relationship    Stability/Clinical Decision Making Evolving/Moderate complexity    Clinical Decision Making Moderate    Rehab Potential Excellent    PT Frequency 1x / week    PT Duration 12 weeks    PT Treatment/Interventions ADLs/Self Care Home Management;Biofeedback;Cryotherapy;Moist Heat;Electrical Stimulation;Therapeutic activities;Therapeutic exercise;Neuromuscular re-education;Patient/family education;Manual techniques;Passive range of motion;Dry needling;Taping    PT Next Visit Plan biofeedback anal sphincters, toileting techniques    PT Home Exercise Plan Access Code: [email or web address]    Consulted and Agree with Plan of Care Patient  Patient will benefit from skilled therapeutic intervention in order to improve the following deficits and impairments:  Decreased coordination, Increased muscle spasms, Impaired flexibility, Decreased strength, Pain  Visit Diagnosis: Muscle weakness (generalized)  Unspecified lack of coordination     Problem List Patient Active Problem List   Diagnosis Date Noted   Dyssynergic defecation    Incontinence of feces    Hyponatremia 06/29/2020   Mixed hyperlipidemia 05/26/2020   Essential hypertension 05/26/2020   Encounter for loop recorder check 02/14/2019   Loop recorder [employer] Linq 02/14/2019   Syncope and collapse 01/22/2019   Exertional chest pain 01/22/2019    Junious Silk, PT 12/05/2020, 5:13 PM  [facility] [address], [address] [place], [place], [address] Phone: [phone]   Fax:  [phone]  Name: KHALAYA MCGURN MRN: [id number] Date of Birth: 19-May-1940

## 2020-12-09 ENCOUNTER — Other Ambulatory Visit: Payer: Self-pay

## 2020-12-09 ENCOUNTER — Ambulatory Visit: Payer: Medicare Other

## 2020-12-09 DIAGNOSIS — R001 Bradycardia, unspecified: Secondary | ICD-10-CM

## 2020-12-09 DIAGNOSIS — R42 Dizziness and giddiness: Secondary | ICD-10-CM

## 2020-12-12 NOTE — Progress Notes (Signed)
Called and spoke with patient regarding her stress test results.

## 2020-12-12 NOTE — Progress Notes (Signed)
Overall low risk stress test.

## 2020-12-15 NOTE — Telephone Encounter (Signed)
From patient.

## 2020-12-16 ENCOUNTER — Other Ambulatory Visit: Payer: Self-pay

## 2020-12-16 DIAGNOSIS — I10 Essential (primary) hypertension: Secondary | ICD-10-CM

## 2020-12-16 LAB — HEPATIC FUNCTION PANEL
ALT: 38 — AB (ref 7–35)
AST: 38 — AB (ref 13–35)
Alkaline Phosphatase: 40 (ref 25–125)
Bilirubin, Total: 0.7

## 2020-12-16 LAB — COMPREHENSIVE METABOLIC PANEL
Albumin: 4.8 (ref 3.5–5.0)
Calcium: 11.1 — AB (ref 8.7–10.7)
GFR calc Af Amer: 58
GFR calc non Af Amer: 47.9

## 2020-12-16 LAB — BASIC METABOLIC PANEL
BUN: 13 (ref 4–21)
CO2: 22 (ref 13–22)
Chloride: 106 (ref 99–108)
Creatinine: 1.1 (ref 0.5–1.1)
Glucose: 92
Potassium: 5.6 — AB (ref 3.4–5.3)
Sodium: 139 (ref 137–147)

## 2020-12-16 LAB — CBC AND DIFFERENTIAL
HCT: 38 (ref 36–46)
Hemoglobin: 13 (ref 12.0–16.0)
Platelets: 188 (ref 150–399)
WBC: 4

## 2020-12-16 LAB — CBC: RBC: 3.8 — AB (ref 3.87–5.11)

## 2020-12-16 NOTE — Telephone Encounter (Signed)
Given elevated blood pressure please have patient resume valsartan and please put in for her to have BMP done 1 week after restarting it. Also please confirm patient is no taking amlodipine, as this was stopped due to bradycardia.

## 2020-12-22 ENCOUNTER — Encounter: Payer: Self-pay | Admitting: Physical Therapy

## 2020-12-22 ENCOUNTER — Other Ambulatory Visit: Payer: Self-pay

## 2020-12-22 ENCOUNTER — Ambulatory Visit: Payer: Medicare Other | Admitting: Physical Therapy

## 2020-12-22 DIAGNOSIS — M6281 Muscle weakness (generalized): Secondary | ICD-10-CM

## 2020-12-22 DIAGNOSIS — R279 Unspecified lack of coordination: Secondary | ICD-10-CM

## 2020-12-22 NOTE — Therapy (Signed)
[facility] [address] [place], [place], [address] Phone: [phone]   Fax:  [phone]  Physical Therapy Treatment  Patient Details  Name: Mary Leblanc MRN: [id number] Date of Birth: 04/04/1941 Referring Provider (PT): Lavena Bullion, [place]   Encounter Date: 12/22/2020   PT End of Session - 12/22/20 1617     Visit Number 2    Date for PT Re-Evaluation 02/27/21    Authorization Type Medicare A/B    PT Start Time 1533    PT Stop Time 1615    PT Time Calculation (min) 42 min    Activity Tolerance Patient tolerated treatment well    Behavior During Therapy [facility] for tasks assessed/performed             Past Medical History:  Diagnosis Date   Anemia    Depression    Encounter for loop recorder check 02/14/2019   GERD (gastroesophageal reflux disease)    HTN (hypertension)    Hyperlipidemia    Osteopenia    Syncope and collapse 01/22/2019    Past Surgical History:  Procedure Laterality Date   ANAL RECTAL MANOMETRY N/A 08/15/2020   Procedure: ANO RECTAL MANOMETRY;  Surgeon: Lavena Bullion, DO;  Location: [facility];  Service: Gastroenterology;  Laterality: N/A;   APPENDECTOMY  2000   BIOPSY  09/15/2020   Procedure: BIOPSY;  Surgeon: Irving Copas., MD;  Location: [place];  Service: Gastroenterology;;   CERVICAL LAMINOPLASTY  2002   CHOLECYSTECTOMY  2013   ESOPHAGOGASTRODUODENOSCOPY (EGD) WITH PROPOFOL N/A 09/15/2020   Procedure: ESOPHAGOGASTRODUODENOSCOPY (EGD) WITH PROPOFOL;  Surgeon: Irving Copas., MD;  Location: [place];  Service: Gastroenterology;  Laterality: N/A;   EUS N/A 09/15/2020   Procedure: UPPER ENDOSCOPIC ULTRASOUND (EUS) RADIAL;  Surgeon: Irving Copas., MD;  Location: [place];  Service: Gastroenterology;  Laterality: N/A;    There were no vitals filed for this visit.   Subjective Assessment - 12/22/20 1536     Subjective I am doing the  exercises.  Last few nights I had better time sleeping.  Other nights I am still up 5 or 6 times.    Patient Stated Goals see if there is anything that can help my digestion and be able to empty completely    Currently in Pain? No/denies                               [facility] Adult PT Treatment/Exercise - 12/22/20 0001       Self-Care   Self-Care Other Self-Care Comments    Other Self-Care Comments  educated and performed toileting techniques      Neuro Re-ed    Neuro Re-ed Details  biofeedback with all stretches and breathing techniques to work on reduced tone; able to get down to 27m from 15-276min supine      Exercises   Exercises Lumbar      Lumbar Exercises: Stretches   Piriformis Stretch Right;Left;3 reps;10 seconds    Other Lumbar Stretch Exercise happy baby - 1 min    Other Lumbar Stretch Exercise thoracic rotation educated and performed; sitting SB educated and performed      Lumbar Exercises: Supine   Other Supine Lumbar Exercises supine diaphragmatic breathing; sidelying diaphragmatic breathing      Manual Therapy   Manual Therapy Myofascial release    Myofascial Release lower abdomen MFR in all 6 planes  PT Education - 12/22/20 1722     Education Details toileting and added stretches    Person(s) Educated Patient    Methods Explanation;Demonstration;Tactile cues;Verbal cues;Handout    Comprehension Verbalized understanding;Returned demonstration              PT Short Term Goals - 12/05/20 1700       PT SHORT TERM GOAL #1   Title Pt will report feelingmore complete BMs    Time 4    Period Weeks    Status New    Target Date 01/02/21      PT SHORT TERM GOAL #2   Title Pt will report ind with initial HEP and toileting techniques    Time 4    Period Weeks    Status New    Target Date 01/02/21               PT Long Term Goals - 12/05/20 1445       PT LONG TERM GOAL #1   Title Pt will be able  to make a week without any smearing or leakage    Time 12    Period Weeks    Status New    Target Date 02/27/21      PT LONG TERM GOAL #2   Title Pt will be able to completely empty bowels at one time without waking throughout the night to have a BM    Time 12    Period Weeks    Status New    Target Date 02/27/21      PT LONG TERM GOAL #3   Title ind with advanced HEP    Time 12    Period Weeks    Status New    Target Date 02/27/21      PT LONG TERM GOAL #4   Title Ind with vaginal release and relaxation techniques including dilators as needed for less pain during intercourse    Time 12    Period Weeks    Status New    Target Date 02/27/21                   Plan - 12/22/20 1618     Clinical Impression Statement Initial treatment today and no goals met yet.  Pt did biofeedback with focus on ways to relax pelvic floor due to her having an elevated resting tone.  Pt had the best response from thoracic rotation and a little better with side bending.  Lying on her side was the most relaxingposition.  Pt was also given education on toileting techniques for improved bowel emptying.    PT Treatment/Interventions ADLs/Self Care Home Management;Biofeedback;Cryotherapy;Moist Heat;Electrical Stimulation;Therapeutic activities;Therapeutic exercise;Neuromuscular re-education;Patient/family education;Manual techniques;Passive range of motion;Dry needling;Taping    PT Next Visit Plan biofeedback #2 anal sphincters, f/u toileting techniques, try gentle core and pelvic strength    PT Home Exercise Plan Access Code: [email or web address]    Consulted and Agree with Plan of Care Patient             Patient will benefit from skilled therapeutic intervention in order to improve the following deficits and impairments:  Decreased coordination, Increased muscle spasms, Impaired flexibility, Decreased strength, Pain  Visit Diagnosis: Muscle weakness (generalized)  Unspecified lack of  coordination     Problem List Patient Active Problem List   Diagnosis Date Noted   Dyssynergic defecation    Incontinence of feces    Hyponatremia 06/29/2020   Mixed hyperlipidemia 05/26/2020   Essential hypertension 05/26/2020  Encounter for loop recorder check 02/14/2019   Loop recorder Medtronic Linq 02/14/2019   Syncope and collapse 01/22/2019   Exertional chest pain 01/22/2019    Jule Ser, PT 12/22/2020, 5:23 PM  [facility] [address], [place], [place], [address] Phone: [phone]   Fax:  [phone]  Name: Mary Leblanc MRN: [id number] Date of Birth: [birth year]

## 2020-12-22 NOTE — Patient Instructions (Signed)

## 2020-12-29 ENCOUNTER — Encounter: Payer: Medicare Other | Admitting: Physical Therapy

## 2021-01-05 ENCOUNTER — Encounter: Payer: Self-pay | Admitting: Physical Therapy

## 2021-01-05 ENCOUNTER — Ambulatory Visit: Payer: Medicare Other | Attending: Gastroenterology | Admitting: Physical Therapy

## 2021-01-05 ENCOUNTER — Other Ambulatory Visit: Payer: Self-pay

## 2021-01-05 DIAGNOSIS — R279 Unspecified lack of coordination: Secondary | ICD-10-CM | POA: Diagnosis present

## 2021-01-05 DIAGNOSIS — M6281 Muscle weakness (generalized): Secondary | ICD-10-CM | POA: Insufficient documentation

## 2021-01-05 NOTE — Therapy (Signed)
[facility] [address] [place], [place], [address] Phone: [phone]   Fax:  [phone]  Physical Therapy Treatment  Patient Details  Name: Mary Leblanc MRN: [id number] Date of Birth: 1940-06-10 Referring Provider (PT): Shell[place] Cleverly, [place]   Encounter Date: 01/05/2021   PT End of Session - 01/05/21 1536     Visit Number 3    Date for PT Re-Evaluation 02/27/21    Authorization Type Medicare A/B    PT Start Time 1401    PT Stop Time 1443    PT Time Calculation (min) 42 min             Past Medical History:  Diagnosis Date   Anemia    Depression    Encounter for loop recorder check 02/14/2019   GERD (gastroesophageal reflux disease)    HTN (hypertension)    Hyperlipidemia    Osteopenia    Syncope and collapse 01/22/2019    Past Surgical History:  Procedure Laterality Date   ANAL RECTAL MANOMETRY N/A 08/15/2020   Procedure: ANO RECTAL MANOMETRY;  Surgeon: Shellia Cleverly, DO;  Location: [facility];  Service: Gastroenterology;  Laterality: N/A;   APPENDECTOMY  2000   BIOPSY  09/15/2020   Procedure: BIOPSY;  Surgeon: Lemar Lofty., MD;  Location: [facility];  Service: Gastroenterology;;   CERVICAL LAMINOPLASTY  2002   CHOLECYSTECTOMY  2013   ESOPHAGOGASTRODUODENOSCOPY (EGD) WITH PROPOFOL N/A 09/15/2020   Procedure: ESOPHAGOGASTRODUODENOSCOPY (EGD) WITH PROPOFOL;  Surgeon: Lemar Lofty., MD;  [facility];  Service: Gastroenterology;  Laterality: N/A;   EUS N/A 09/15/2020   Procedure: UPPER ENDOSCOPIC ULTRASOUND (EUS) RADIAL;  Surgeon: Lemar Lofty., MD;  Location: [facility];  Service: Gastroenterology;  Laterality: N/A;    There were no vitals filed for this visit.   Subjective Assessment - 01/05/21 1406     Subjective I was feeling queezy last week.  I am up less times at night.  I think the stool to put my feet on helps a little.  I had one episode  two weeks ago and I think it was something I ate.    Pertinent History sinus bradycardia, syncope, history of 4th degree tear    Patient Stated Goals see if there is anything that can help my digestion and be able to empty completely    Currently in Pain? No/denies                               [facility] Adult PT Treatment/Exercise - 01/05/21 0001       Lumbar Exercises: Stretches   Active Hamstring Stretch Right;Left;3 reps;30 seconds      Lumbar Exercises: Supine   AB Set Limitations TrA contraction in supine with verbal and TCs    Bridge with [employer] 20 reps    Large Ball Abdominal Isometric 20 reps   UE overhead                     PT Short Term Goals - 01/05/21 1536       PT SHORT TERM GOAL #1   Title Pt will report feelingmore complete BMs    Status On-going      PT SHORT TERM GOAL #2   Title Pt will report ind with initial HEP and toileting techniques    Status Achieved  PT Long Term Goals - 12/05/20 1445       PT LONG TERM GOAL #1   Title Pt will be able to make a week without any smearing or leakage    Time 12    Period Weeks    Status New    Target Date 02/27/21      PT LONG TERM GOAL #2   Title Pt will be able to completely empty bowels at one time without waking throughout the night to have a BM    Time 12    Period Weeks    Status New    Target Date 02/27/21      PT LONG TERM GOAL #3   Title ind with advanced HEP    Time 12    Period Weeks    Status New    Target Date 02/27/21      PT LONG TERM GOAL #4   Title Ind with vaginal release and relaxation techniques including dilators as needed for less pain during intercourse    Time 12    Period Weeks    Status New    Target Date 02/27/21                   Plan - 01/05/21 1446     Clinical Impression Statement Pt did well with addition of core.  Pt was educated in how to engage deep core and pelvic floor.  Needed some tactile cues  to make sure she is lifting the pelvic floor and did well with breathing while keeping core engaged.  Exercises added to HEP to progress strength    PT Treatment/Interventions ADLs/Self Care Home Management;Biofeedback;Cryotherapy;Moist Heat;Electrical Stimulation;Therapeutic activities;Therapeutic exercise;Neuromuscular re-education;Patient/family education;Manual techniques;Passive range of motion;Dry needling;Taping    PT Next Visit Plan biofeedback #2 if needed to anal sphincters, f/u new ex's in HEP; progress core and pelvic floor strength    PT Home Exercise Plan Access Code: [email or web address]    Consulted and Agree with Plan of Care Patient             Patient will benefit from skilled therapeutic intervention in order to improve the following deficits and impairments:  Decreased coordination, Increased muscle spasms, Impaired flexibility, Decreased strength, Pain  Visit Diagnosis: Muscle weakness (generalized)  Unspecified lack of coordination     Problem List Patient Active Problem List   Diagnosis Date Noted   Dyssynergic defecation    Incontinence of feces    Hyponatremia 06/29/2020   Mixed hyperlipidemia 05/26/2020   Essential hypertension 05/26/2020   Encounter for loop recorder check 02/14/2019   Loop recorder Medtronic Linq 02/14/2019   Syncope and collapse 01/22/2019   Exertional chest pain 01/22/2019    Brayton Caves Sal Spratley, PT 01/05/2021, 4:35 PM  [facility] [address] [place], [place], [address] Phone: [phone]   Fax:  [phone]  Name: Mary Leblanc MRN: [id number] Date of Birth: [birth year]

## 2021-01-12 ENCOUNTER — Ambulatory Visit: Payer: Medicare Other | Admitting: Physical Therapy

## 2021-01-12 ENCOUNTER — Other Ambulatory Visit: Payer: Self-pay

## 2021-01-12 DIAGNOSIS — M6281 Muscle weakness (generalized): Secondary | ICD-10-CM

## 2021-01-12 DIAGNOSIS — R279 Unspecified lack of coordination: Secondary | ICD-10-CM

## 2021-01-12 NOTE — Patient Instructions (Signed)
Moisturizing: Use coconut oil and make small marble size balls, put them in the freezer and one/night in the vaginal canal.  Can also use for a lubricant during intercourse.  PROTOCOL FOR VAGINAL DILATORS   Wash dilator with soap and water prior to insertion.    Lay on your back reclined. Knees are to be up and apart while on your bed or in the bathtub with warm water.   Lubricate the end of the dilator with a water-soluble lubricant.  Separate the labia.   Tense the pelvic floor muscles than relax; while relaxing, slide lubricated dilator( round side of dilator) into the vagina.  Insert toward the direction of your spine. Dilator should feel snug and no pain more than 3/10.   Tense muscles again while holding the dilator so it does not get pushed out; relax and slide it in a little further.   Try blowing out as if filling a balloon; this may relax the muscles and allow penetration.  Repeat blowing out to insert dilator further.  Keep dilator in for 10 minutes if tolerate, with the pelvic floor muscles relaxed to further stretch the canal.   Never force the dilator into the canal. Once the dilator is comfortable start to move in and out, side to side, move your hips in different directions  3-4 times per week Progression of dilator.  When you are able to place dilator into vaginal canal and feel no pain or able to move without difficulty you are ready for the next size.  Before you go to the next size start with the original size for 2 minutes then use the next size up for 5 minutes. When the next size up is easy to use, do not have to start with the smaller size.   [facility] [address], [address] [place], [place] [address] Phone # [phone] Fax [phone]

## 2021-01-12 NOTE — Therapy (Signed)
[facility] [address], [place], [place], [address] Phone: [phone]   Fax:  [phone]  Physical Therapy Treatment  Patient Details  Name: Mary Leblanc MRN: [id number] Date of Birth: 11/03/1940 Referring Provider (PT): Lavena Bullion, [place]   Encounter Date: 01/12/2021   PT End of Session - 01/12/21 1408     Visit Number 4    Date for PT Re-Evaluation 02/27/21    Authorization Type Medicare A/B    PT Start Time 1791    PT Stop Time 1442    PT Time Calculation (min) 40 min    Activity Tolerance Patient tolerated treatment well    Behavior During Therapy [facility] for tasks assessed/performed             Past Medical History:  Diagnosis Date   Anemia    Depression    Encounter for loop recorder check 02/14/2019   GERD (gastroesophageal reflux disease)    HTN (hypertension)    Hyperlipidemia    Osteopenia    Syncope and collapse 01/22/2019    Past Surgical History:  Procedure Laterality Date   ANAL RECTAL MANOMETRY N/A 08/15/2020   Procedure: ANO RECTAL MANOMETRY;  Surgeon: Lavena Bullion, DO;  Location: [facility];  Service: Gastroenterology;  Laterality: N/A;   APPENDECTOMY  2000   BIOPSY  09/15/2020   Procedure: BIOPSY;  Surgeon: Irving Copas., MD;  Location: [place];  Service: Gastroenterology;;   CERVICAL LAMINOPLASTY  2002   CHOLECYSTECTOMY  2013   ESOPHAGOGASTRODUODENOSCOPY (EGD) WITH PROPOFOL N/A 09/15/2020   Procedure: ESOPHAGOGASTRODUODENOSCOPY (EGD) WITH PROPOFOL;  Surgeon: Irving Copas., MD;  Location: [place];  Service: Gastroenterology;  Laterality: N/A;   EUS N/A 09/15/2020   Procedure: UPPER ENDOSCOPIC ULTRASOUND (EUS) RADIAL;  Surgeon: Irving Copas., MD;  Location: [place];  Service: Gastroenterology;  Laterality: N/A;    There were no vitals filed for this visit.   Subjective Assessment - 01/12/21 1406     Subjective I am not taking  the citrucel anymore and that helped.  I am taking 4 mg of immodium and that is helping. Not having the smearning.    Pertinent History sinus bradycardia, syncope, history of 4th degree tear    Patient Stated Goals see if there is anything that can help my digestion and be able to empty completely    Currently in Pain? No/denies                               [facility] Adult PT Treatment/Exercise - 01/12/21 0001       Self-Care   Other Self-Care Comments  educated on dilaotrs      Lumbar Exercises: Quadruped   Single Arm Raise Right;Left;15 reps;2 seconds    Other Quadruped Lumbar Exercises rocking    Other Quadruped Lumbar Exercises child pose      Manual Therapy   Myofascial Release lumbar and thoracic erectors                       PT Short Term Goals - 01/12/21 1405       PT SHORT TERM GOAL #1   Title Pt will report feelingmore complete BMs    Status Achieved      PT SHORT TERM GOAL #2   Title Pt will report ind with initial HEP and toileting techniques    Status Achieved  PT Long Term Goals - 01/12/21 1405       PT LONG TERM GOAL #1   Title Pt will be able to make a week without any smearing or leakage    Baseline hasn't had this week    Status Partially Met      PT LONG TERM GOAL #2   Title Pt will be able to completely empty bowels at one time without waking throughout the night to have a BM      PT LONG TERM GOAL #3   Title ind with advanced HEP    Status On-going      PT LONG TERM GOAL #4   Title Ind with vaginal release and relaxation techniques including dilators as needed for less pain during intercourse    Status On-going                   Plan - 01/12/21 1622     Clinical Impression Statement Pt has been doing better with management of leakage.  She is having difficulty emptying her bladder currently.  Pt had a lot of tension in lumbar region and TTP multifidi on the Lt side.  Pt did well  with STM to release the fascial resriction and adhesion in muscle tissue.  Pt was given stretch in child pose to maintain improved soft tissue length.  She was given core exercise to continue to reduce need for low back and pelvic floor to become so tense.  Pt continues to show benefits from skilled PT and expected to make further progress towards her functional goals.    PT Treatment/Interventions ADLs/Self Care Home Management;Biofeedback;Cryotherapy;Moist Heat;Electrical Stimulation;Therapeutic activities;Therapeutic exercise;Neuromuscular re-education;Patient/family education;Manual techniques;Passive range of motion;Dry needling;Taping    PT Next Visit Plan core strength, lumbar and thoracic STM and flexibility    PT Home Exercise Plan Access Code: [email or web address]    Consulted and Agree with Plan of Care Patient             Patient will benefit from skilled therapeutic intervention in order to improve the following deficits and impairments:  Decreased coordination, Increased muscle spasms, Impaired flexibility, Decreased strength, Pain  Visit Diagnosis: Muscle weakness (generalized)  Unspecified lack of coordination     Problem List Patient Active Problem List   Diagnosis Date Noted   Dyssynergic defecation    Incontinence of feces    Hyponatremia 06/29/2020   Mixed hyperlipidemia 05/26/2020   Essential hypertension 05/26/2020   Encounter for loop recorder check 02/14/2019   Loop recorder Medtronic Linq 02/14/2019   Syncope and collapse 01/22/2019   Exertional chest pain 01/22/2019    Camillo Flaming Geza Beranek, PT 01/12/2021, 4:35 PM  [facility] [address], [place], [place], [address] Phone: [phone]   Fax:  [phone]  Name: LOUANN HOPSON MRN: [id number] Date of Birth: [birth year]

## 2021-01-18 ENCOUNTER — Other Ambulatory Visit: Payer: Self-pay

## 2021-01-18 ENCOUNTER — Ambulatory Visit: Payer: Medicare Other | Admitting: Physical Therapy

## 2021-01-18 DIAGNOSIS — M6281 Muscle weakness (generalized): Secondary | ICD-10-CM | POA: Diagnosis not present

## 2021-01-18 DIAGNOSIS — R279 Unspecified lack of coordination: Secondary | ICD-10-CM

## 2021-01-18 NOTE — Therapy (Signed)
[facility] [address], [place], [place], [address] Phone: [phone]   Fax:  [phone]  Physical Therapy Treatment  Patient Details  Name: Mary Leblanc MRN: [id number] Date of Birth: 01-10-1941 Referring Provider (PT): Lavena Bullion, [place]   Encounter Date: 01/18/2021   PT End of Session - 01/18/21 1439     Visit Number 5    Date for PT Re-Evaluation 02/27/21    Authorization Type Medicare A/B    PT Start Time 1401    PT Stop Time 1440    PT Time Calculation (min) 39 min    Activity Tolerance Patient tolerated treatment well    Behavior During Therapy [facility] for tasks assessed/performed             Past Medical History:  Diagnosis Date   Anemia    Depression    Encounter for loop recorder check 02/14/2019   GERD (gastroesophageal reflux disease)    HTN (hypertension)    Hyperlipidemia    Osteopenia    Syncope and collapse 01/22/2019    Past Surgical History:  Procedure Laterality Date   ANAL RECTAL MANOMETRY N/A 08/15/2020   Procedure: ANO RECTAL MANOMETRY;  Surgeon: Lavena Bullion, DO;  Location: [facility];  Service: Gastroenterology;  Laterality: N/A;   APPENDECTOMY  2000   BIOPSY  09/15/2020   Procedure: BIOPSY;  Surgeon: Irving Copas., MD;  Location: [place];  Service: Gastroenterology;;   CERVICAL LAMINOPLASTY  2002   CHOLECYSTECTOMY  2013   ESOPHAGOGASTRODUODENOSCOPY (EGD) WITH PROPOFOL N/A 09/15/2020   Procedure: ESOPHAGOGASTRODUODENOSCOPY (EGD) WITH PROPOFOL;  Surgeon: Irving Copas., MD;  Location: [place];  Service: Gastroenterology;  Laterality: N/A;   EUS N/A 09/15/2020   Procedure: UPPER ENDOSCOPIC ULTRASOUND (EUS) RADIAL;  Surgeon: Irving Copas., MD;  Location: [place] [place] [facility];  Service: Gastroenterology;  Laterality: N/A;    There were no vitals filed for this visit.   Subjective Assessment - 01/18/21 1412     Subjective I had a little  leakage yesterday.  I am not getting up at night to use the bathroom    Pertinent History sinus bradycardia, syncope, history of 4th degree tear    Patient Stated Goals see if there is anything that can help my digestion and be able to empty completely    Currently in Pain? No/denies                               [facility] Adult PT Treatment/Exercise - 01/18/21 0001       Self-Care   Other Self-Care Comments  educated on sidelying with towel roll      Lumbar Exercises: Standing   Other Standing Lumbar Exercises lean on mat with hip ext kegel and relax      Lumbar Exercises: Supine   Bridge with [employer] Limitations bridge with ball overhead    Large Ball Abdominal Isometric 20 reps    Large Ball Abdominal Isometric Limitations overhead UE and rolling LE    Other Supine Lumbar Exercises supine ball passing overhead to LE small ball - 10x      Manual Therapy   Myofascial Release lumbar and thoracic erectors                       PT Short Term Goals - 01/12/21 1405       PT SHORT TERM GOAL #1  Title Pt will report feelingmore complete BMs    Status Achieved      PT SHORT TERM GOAL #2   Title Pt will report ind with initial HEP and toileting techniques    Status Achieved               PT Long Term Goals - 01/18/21 1406       PT LONG TERM GOAL #1   Title Pt will be able to make a week without any smearing or leakage    Baseline had leakage this week 1x    Status Partially Met      PT LONG TERM GOAL #2   Title Pt will be able to completely empty bowels at one time without waking throughout the night to have a BM    Baseline has BM all at one time and not waking at night    Status Achieved      PT LONG TERM GOAL #3   Title ind with advanced HEP    Status On-going      PT LONG TERM GOAL #4   Title Ind with vaginal release and relaxation techniques including dilators as needed for less pain during intercourse    Baseline has not  started    Status On-going                   Plan - 01/18/21 1606     Clinical Impression Statement Pt is doing well with stretches.  Today's session addressing postural deviations and she does well lying on left side with towel underneath her waiste to support that side.  She tends to side bend to the Rt in lumbar region creating compression and pain on that side.  Pt had good release with STM and then focus on more core strengthening to stabilize.  pt will benefit from skilled PT to continue to address postural and pelvic floor functional strength    PT Treatment/Interventions ADLs/Self Care Home Management;Biofeedback;Cryotherapy;Moist Heat;Electrical Stimulation;Therapeutic activities;Therapeutic exercise;Neuromuscular re-education;Patient/family education;Manual techniques;Passive range of motion;Dry needling;Taping    PT Next Visit Plan sitting on ball to relax pelvic floor, breathing and bulging into the ball; core strength on ball, band ex's pallof press    PT Home Exercise Plan Access Code: [email or web address]    Consulted and Agree with Plan of Care Patient             Patient will benefit from skilled therapeutic intervention in order to improve the following deficits and impairments:  Decreased coordination, Increased muscle spasms, Impaired flexibility, Decreased strength, Pain  Visit Diagnosis: Muscle weakness (generalized)  Unspecified lack of coordination     Problem List Patient Active Problem List   Diagnosis Date Noted   Dyssynergic defecation    Incontinence of feces    Hyponatremia 06/29/2020   Mixed hyperlipidemia 05/26/2020   Essential hypertension 05/26/2020   Encounter for loop recorder check 02/14/2019   Loop recorder [employer] Linq 02/14/2019   Syncope and collapse 01/22/2019   Exertional chest pain 01/22/2019    Camillo Flaming Dellia Donnelly, PT 01/18/2021, 4:16 PM  [facility] [address] [place], [place], [address] Phone: [phone]   Fax:  [phone]  Name: JAILEEN JANELLE MRN: [id number] Date of Birth: 1940-11-17

## 2021-01-25 ENCOUNTER — Ambulatory Visit: Payer: Medicare Other | Admitting: Cardiology

## 2021-01-26 ENCOUNTER — Encounter: Payer: Medicare Other | Admitting: Physical Therapy

## 2021-01-27 ENCOUNTER — Other Ambulatory Visit: Payer: Self-pay

## 2021-01-27 ENCOUNTER — Ambulatory Visit: Payer: Medicare Other | Admitting: Cardiology

## 2021-01-27 ENCOUNTER — Encounter: Payer: Self-pay | Admitting: Cardiology

## 2021-01-27 VITALS — BP 121/60 | HR 56 | Temp 97.9°F | Ht 60.0 in | Wt 109.0 lb

## 2021-01-27 DIAGNOSIS — I10 Essential (primary) hypertension: Secondary | ICD-10-CM

## 2021-01-27 NOTE — Progress Notes (Signed)
Patient referred by Haywood Pao, MD for syncope  Subjective:   Mary Leblanc, female    DOB: 04/22/1941, 80 y.o.   MRN: [id number]   Chief Complaint  Patient presents with   Hypertension   Follow-up   80 y.o. Caucasian female with hypertension, syncope  Patient is doing well from cardiac standpoint, denies any chest pain, shortness of breath.  She has occasional lightheadedness, but has not had any recent syncope.  She continues to struggle with her GI symptoms of diarrhea.  She has regular follow-up with GI regarding this.  Current Outpatient Medications on File Prior to Visit  Medication Sig Dispense Refill   alendronate (FOSAMAX) 70 MG tablet Take 70 mg by mouth once a week. Take with a full glass of water on an empty stomach.     aspirin EC 81 MG tablet Take 81 mg by mouth at bedtime.     B Complex-C (B-COMPLEX WITH VITAMIN C) tablet Take 1 tablet by mouth daily.     calcium carbonate (OSCAL) 1500 (600 Ca) MG TABS tablet Take 600 mg of elemental calcium by mouth daily with breakfast.     escitalopram (LEXAPRO) 10 MG tablet Take 10 mg by mouth daily.     gabapentin (NEURONTIN) 300 MG capsule Take 600 mg by mouth 2 (two) times daily.     loperamide (IMODIUM) 2 MG capsule Take 8 mg by mouth at bedtime.     melatonin 1 MG TABS tablet Take 1 mg by mouth at bedtime.     Methylcellulose, Laxative, (CITRUCEL PO) Take 2 tablets by mouth in the morning and at bedtime.     omeprazole (PRILOSEC) 20 MG capsule Take 20 mg by mouth daily.     rosuvastatin (CRESTOR) 10 MG tablet TAKE 1 TABLET BY MOUTH EVERY DAY (Patient taking differently: Take 10 mg by mouth daily.) 90 tablet 1   No current facility-administered medications on file prior to visit.    Cardiovascular studies:  Remote loop recorder transmission 01/23/2021: Predominant rhythm is normal sinus rhythm. EKG = PACs on 01/23/2021.  No pauses, no heart block.  No symptoms reported. Personally reviewed. No AF  Exercise  treadmill stress test 12/09/2020: Exercise treadmill stress test performed using Bruce protocol.  Patient reached 8.4 METS, and 92% of age predicted maximum heart rate.  Exercise capacity was fair.  No chest pain reported.  Normal heart rate and hemodynamic response. Stress EKG revealed no ischemic changes. Low risk study.  Scheduled Remote loop recorder check 06/07/2020:  The presenting rhythm is sinus arrhythmia. No symptomatic or automatic episodes were triggered. Daily activity trends are stable. Heart rate variability trends are stable. No heart block.  Continue remote monitoring.  Echocardiogram 06/01/2020:  Left ventricle cavity is normal in size and wall thickness. Normal global  wall motion. Normal LV systolic function with EF 66%. Doppler evidence of  grade I (impaired) diastolic dysfunction, normal LAP.  Mild tricuspid regurgitation.  No evidence of pulmonary hypertension.  EKG 05/27/2020: Sinus rhytm 54 bpm Normal EKG  Exercise tetrofosmin stress test  03/09/2019: Normal ECG stress. exercised for a total of 6 minutes and 0 seconds, achieving approximately 7.05 METs. Baseline heart rate was measured at 62 bpm. A maximum heart rate of 128 beats per minute was achieved, which is 90% of maximum predicted heart rate.  During exercise the patient developed  dyspnea, dizziness, & chest tightness 5/10. Exercise was terminated due to target heart rate achieved.  The calculated Duke Treadmill Score is  2.00 (Moderate risk) Normal myocardial perfusion. Stress LV EF: 62%. Low risk study.  No previous exam available for comparison.  Recent labs: 06/24/2020: Glucose 89, BUN/Cr 22/1.01. EGFR 53. Na/K 128/4.5. AlKP 32. Rest of the CMP normal  06/09/2020: Glucose 173, BUN/Cr 24/1.27. EGFR 43. Na/K 136/4.8. AlKP 34. Rest of the CMP normal  2020: Cholesterol 202, triglycerides 236, HDL 57, LDL 98. Apolipoprotein B 107 (<90)   Review of Systems  Cardiovascular:  Negative for chest pain, dyspnea on  exertion, leg swelling, palpitations and syncope.       Vitals:   01/27/21 0841  BP: 121/60  Pulse: (!) 56  Temp: 97.9 F (36.6 C)  SpO2: 98%     Objective:   Physical Exam Vitals and nursing note reviewed.  Constitutional:      General: She is not in acute distress. Neck:     Vascular: No JVD.  Cardiovascular:     Rate and Rhythm: Normal rate and regular rhythm.     Heart sounds: Normal heart sounds. No murmur heard. Pulmonary:     Effort: Pulmonary effort is normal.     Breath sounds: Normal breath sounds. No wheezing or rales.          Assessment & Recommendations:   80 y.o. Caucasian female with hypertension, syncope  Syncope: History, vasovagal syncope, with trigger being acute abdominal pain and large bowel movement. No recurrence.  Recommend wearing compression stockings  Primary hypertension: Controlled  Mixed hyperlipidemia: Continue rosuvastatin 10 mg Has upcoming annual labs with PCP, will follow-up.  Primary prevention: No primary indication for aspirin.  Discontinued.  F/u in 1 year    Nigel Mormon, MD Pager: [phone] Office: [phone]

## 2021-02-01 ENCOUNTER — Encounter: Payer: Self-pay | Admitting: Nurse Practitioner

## 2021-02-01 ENCOUNTER — Other Ambulatory Visit (INDEPENDENT_AMBULATORY_CARE_PROVIDER_SITE_OTHER): Payer: Medicare Other

## 2021-02-01 ENCOUNTER — Ambulatory Visit (INDEPENDENT_AMBULATORY_CARE_PROVIDER_SITE_OTHER): Payer: Medicare Other | Admitting: Nurse Practitioner

## 2021-02-01 VITALS — BP 104/64 | HR 60 | Ht 60.0 in | Wt 110.2 lb

## 2021-02-01 DIAGNOSIS — R197 Diarrhea, unspecified: Secondary | ICD-10-CM

## 2021-02-01 LAB — BASIC METABOLIC PANEL
BUN: 20 mg/dL (ref 6–23)
CO2: 29 mEq/L (ref 19–32)
Calcium: 10.6 mg/dL — ABNORMAL HIGH (ref 8.4–10.5)
Chloride: 101 mEq/L (ref 96–112)
Creatinine, Ser: 1.08 mg/dL (ref 0.40–1.20)
GFR: 48.85 mL/min — ABNORMAL LOW (ref >60.00)
Glucose, Bld: 85 mg/dL (ref 70–99)
Potassium: 4.3 mEq/L (ref 3.5–5.1)
Sodium: 138 mEq/L (ref 135–145)

## 2021-02-01 LAB — TSH: TSH: 1.25 u[IU]/mL (ref 0.35–5.50)

## 2021-02-01 NOTE — Patient Instructions (Addendum)
You have been given a testing kit to check for small intestine bacterial overgrowth (SIBO) which is completed by a company named [facility].   Make sure to return your test in the mail using the return mailing label given to you along with the kit. Your demographic and insurance information have already been sent to the company and they should be in contact with you over the next week regarding this test.   [facility] will collect an upfront charge of $99.74 for commercial insurance plans and $209.74 is you are paying cash. Make sure to discuss with [facility] PRIOR to having the test if they have gotten informatoin from your insurance company as to how much your testing will cost out of pocket, if any. Please keep in mind that you will be getting a call from phone number [phone] or a similar number. If you do not hear from them within this time frame, please call our office at [phone].   LABS:  Lab work has been ordered for you today. Our lab is located in the basement. Press "B" on the elevator. The lab is located at the first door on the left as you exit the elevator.  HEALTHCARE LAWS AND MY CHART RESULTS: Due to recent changes in healthcare laws, you may see the results of your imaging and laboratory studies on MyChart before your provider has had a chance to review them.   We understand that in some cases there may be results that are confusing or concerning to you. Not all laboratory results come back in the same time frame and the provider may be waiting for multiple results in order to interpret others.  Please give [place] 48 hours in order for your provider to thoroughly review all the results before contacting the office for clarification of your results.   RECOMMENDATIONS: Florastor Probiotic, take 1 twice a day for 4-6 weeks. Ibgard, take 1 capsule 2-3 times a day. Call or send a message on MyChart in 2-3 weeks to update [place] on how you are doing.  It was great  seeing you today! Thank you for entrusting me with your care and choosing [facility].  Noralyn Pick, CRNP  The [facility] providers would like to encourage you to use [facility] to communicate with providers for non-urgent requests or questions.  Due to long hold times on the telephone, sending your provider a message by [facility] may be faster and more efficient way to get a response. Please allow 48 business hours for a response.  Please remember that this is for non-urgent requests/questions. If you are age 76 or older, your body mass index should be between 23-30. Your Body mass index is 21.53 kg/m. If this is out of the aforementioned range listed, please consider follow up with your Primary Care Provider.  If you are age 55 or younger, your body mass index should be between 19-25. Your Body mass index is 21.53 kg/m. If this is out of the aformentioned range listed, please consider follow up with your Primary Care Provider.

## 2021-02-01 NOTE — Progress Notes (Signed)
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  S/P appendectomy, cholecystectomy 2013 and neck surgery.  She is followed by Dr. Barron Alvine.  She continues to have intermittent episodes of lower abdominal pain with nonbloody diarrhea which is sometimes explosive for the past 7 to 8 months. She has diarrhea episodes during the day and sometimes during the night. She worries about leaving the house in fear she will have an attack of abdominal pain and diarrhea. Her husband is present and he is concerned that his wife is sleeping 12 hours most nights then sleeps for a few hours during the day. She denies having any further syncopal episodes as previously experienced after passing a BM (refer to office consult note 06/24/2020). She does have bradycardia, followed by cardiology.   As previously reviewed, she underwent a colonoscopy at the [facility] in [place] in 10/15/2017 which showed external hemorrhoids, no polyps. Recall colonoscopy in 5 years. She is completed 6 or 7 colonoscopies in her lifetime and a single polyp was removed during 1 of these colonoscopies, further details are unclear.  She underwent a CTAP 07/06/2020 which showed the following: 1. No acute CT findings of the abdomen or pelvis to explain diarrhea or abdominal pain. 2. The pancreatic duct is diffusely dilated to the ampulla, measuring up to 6 mm. No obvious lesion or other source of obstruction identified by CT. This is of uncertain significance. Consider MRCP and/or ERCP to further assess. 3. Status post cholecystectomy. Postoperative biliary dilatation.  Due to the pancreatic duct dilation per CT, an abdominal MRI/MRCP was done 07/24/2020 which  showed the following:   1. There is very mild dilatation of the main pancreatic duct, as well as the common bile duct and intrahepatic biliary tree. No pancreatic head mass is confidently identified. No definite ampullary lesion. Findings may simply reflect benign post cholecystectomy physiology. Correlation with liver function tests is recommended. If there is clinical concern for biliary tract obstruction or pancreatic obstruction, further evaluation with endoscopy and endoscopic ultrasound could be considered. 2. Mild hepatic steatosis  She subsequently underwent an EUS 09/15/2020, see detailed report below.  EGD Impression: - No gross lesions in esophagus. Z-line regular, 40 cm from the incisors. - Erythematous mucosa in the stomach. Biopsies negative for H. Pylori.  - No gross lesions in the duodenal bulb, in the first portion of the duodenum and in the second portion of the duodenum. - Normal major papilla. EUS Impression: - Pancreatic parenchymal abnormalities consisting of hyperechoic strands were noted in the entire pancreas. - The pancreatic duct had a dilated endosonographic appearance in the pancreatic head, genu of the pancreas, body of the pancreas and tail of the pancreas (but as noted above tapering proximally was noted). - The pancreatic duct had prominent side-branches in the body/tail region of the pancreas. - The pancreatic duct had a tortuous/ectatic appearance within the genu transition to body of the pancreas. - No pancreatic mass noted. - There was dilation in the common bile duct and in the common hepatic duct. - No ampullary mass noted. - No malignant-appearing lymph nodes were visualized in the celiac region (level 20), peripancreatic region and porta hepatis region.  Dr. Meridee Score recommended a repeat abdominal MRI/MRCP in 1 year.  Current Outpatient Medications on File Prior to Visit  Medication Sig Dispense Refill   alendronate (FOSAMAX) 70 MG  tablet Take 70 mg by mouth once a week. Take with a full glass of water on an empty stomach.     B Complex-C (B-COMPLEX WITH VITAMIN C) tablet Take 1 tablet by mouth daily.     calcium carbonate (OSCAL) 1500 (600 Ca) MG TABS tablet Take 600 mg of elemental calcium by mouth daily with breakfast.     escitalopram (LEXAPRO) 10 MG tablet Take 15 mg by mouth daily.     gabapentin (NEURONTIN) 300 MG capsule Take 600 mg by mouth 3 (three) times daily.     loperamide (IMODIUM) 2 MG capsule Take 8 mg by mouth at bedtime.     melatonin 1 MG TABS tablet Take 1 mg by mouth at bedtime.     omeprazole (PRILOSEC) 20 MG capsule Take 20 mg by mouth daily.     rosuvastatin (CRESTOR) 10 MG tablet TAKE 1 TABLET BY MOUTH EVERY DAY (Patient taking differently: Take 10 mg by mouth daily.) 90 tablet 1   valsartan (DIOVAN) 80 MG tablet Take 80 mg by mouth daily.     No current facility-administered medications on file prior to visit.   No Known Allergies  Current Medications, Allergies, Past Medical History, Past Surgical History, Family History and Social History were reviewed in [employer] record.   Review of Systems:   Constitutional: Negative for fever, sweats, chills or weight loss.  Respiratory: Negative for shortness of breath.   Cardiovascular: + Bradycardia.  Negative for chest pain, palpitations and leg swelling.  Gastrointestinal: See HPI.  Musculoskeletal: Negative for back pain or muscle aches.  Neurological: Negative for dizziness, headaches or paresthesias.   Physical Exam: BP 104/64 (BP Location: Left Arm, Patient Position: Sitting, Cuff Size: Normal)   Pulse 60   Ht 5' (1.524 m)   Wt 110 lb 4 oz (50 kg)   BMI 21.53 kg/m   Wt Readings from Last 3 Encounters:  02/01/21 110 lb 4 oz (50 kg)  01/27/21 109 lb (49.4 kg)  11/10/20 113 lb (51.3 kg)    General: 80 year old female in no acute distress. Head: Normocephalic and atraumatic. Eyes: No scleral icterus.  Conjunctiva pink . Ears: Normal auditory acuity. Mouth: Dentition intact. No ulcers or lesions.  Lungs: Clear throughout to auscultation. Heart: bradycardic,  no murmur. Abdomen: Soft, nontender and nondistended. No masses or hepatomegaly. Normal bowel sounds x 4 quadrants.  Rectal: Deferred.  Musculoskeletal: Symmetrical with no gross deformities. Extremities: No edema. Neurological: Alert oriented x 4. No focal deficits.  Psychological: Alert and cooperative. Normal mood and affect  Assessment and Recommendations:  78) 80 year old female with recurrent episodic diarrhea with associated lower abdominal pain. CTAP showed a normal colon. Last reported colonoscopy was done at the [facility] in 10/2017 showed external hemorrhoids otherwise was normal. -Fecal pancreatic elastase level -Fecal calprotectin level -SIBO breath test -TSH, TTG,IgA level -Florastor probiotic one po bid -Ibgard 1 po two to three times daily for abdominal pain  -Consider changing PPI to H2 blocker (PPI may contribute to diarrhea) -Discussed scheduling a future diagnostic colonoscopy after the above test results reviewed  2) Pancreatic duct dilatation per CT and MRI/MRCP imaging without evidence of an ampullary or pancreatic mass. -Repeat abdominal MRI/MRCP 09/2021  3) History of syncope, likely vasovagal with trigger being acute abdominal pain with large BMs  4) Bradycardia. Loop recorder transmission from May  2022 revealed no significant bradycardia arrhythmias but rather sinus arrhythmia per cardiology.  5) Fatigue, reported sleeping 12 to 16 hrs daily

## 2021-02-02 ENCOUNTER — Other Ambulatory Visit: Payer: Self-pay

## 2021-02-02 ENCOUNTER — Encounter: Payer: Self-pay | Admitting: Physical Therapy

## 2021-02-02 ENCOUNTER — Ambulatory Visit: Payer: Medicare Other | Admitting: Physical Therapy

## 2021-02-02 DIAGNOSIS — M6281 Muscle weakness (generalized): Secondary | ICD-10-CM

## 2021-02-02 DIAGNOSIS — R279 Unspecified lack of coordination: Secondary | ICD-10-CM

## 2021-02-02 LAB — TISSUE TRANSGLUTAMINASE ABS,IGG,IGA
(tTG) Ab, IgA: <1 U/mL
(tTG) Ab, IgG: <1 U/mL

## 2021-02-02 LAB — IGA: Immunoglobulin A: 177 mg/dL (ref 70–320)

## 2021-02-02 NOTE — Therapy (Signed)
[facility] [address], [place], [place], [address] Phone: [phone]   Fax:  [phone]  Physical Therapy Treatment  Patient Details  Name: Mary Leblanc MRN: [id number] Date of Birth: 02/27/1941 Referring Provider (PT): Lavena Bullion, [place]   Encounter Date: 02/02/2021   PT End of Session - 02/02/21 1408     Visit Number 6    Date for PT Re-Evaluation 02/27/21    Authorization Type Medicare A/B    PT Start Time 1406    PT Stop Time 1444    PT Time Calculation (min) 38 min    Activity Tolerance Patient tolerated treatment well    Behavior During Therapy [facility] for tasks assessed/performed             Past Medical History:  Diagnosis Date   Anemia    Depression    Encounter for loop recorder check 02/14/2019   GERD (gastroesophageal reflux disease)    HTN (hypertension)    Hyperlipidemia    Osteopenia    Syncope and collapse 01/22/2019    Past Surgical History:  Procedure Laterality Date   ANAL RECTAL MANOMETRY N/A 08/15/2020   Procedure: ANO RECTAL MANOMETRY;  Surgeon: Lavena Bullion, DO;  Location: [facility];  Service: Gastroenterology;  Laterality: N/A;   APPENDECTOMY  2000   BIOPSY  09/15/2020   Procedure: BIOPSY;  Surgeon: Irving Copas., MD;  Location: [place];  Service: Gastroenterology;;   CERVICAL LAMINOPLASTY  2002   CHOLECYSTECTOMY  2013   ESOPHAGOGASTRODUODENOSCOPY (EGD) WITH PROPOFOL N/A 09/15/2020   Procedure: ESOPHAGOGASTRODUODENOSCOPY (EGD) WITH PROPOFOL;  Surgeon: Irving Copas., MD;  Location: [place];  Service: Gastroenterology;  Laterality: N/A;   EUS N/A 09/15/2020   Procedure: UPPER ENDOSCOPIC ULTRASOUND (EUS) RADIAL;  Surgeon: Irving Copas., MD;  Location: [place];  Service: Gastroenterology;  Laterality: N/A;    There were no vitals filed for this visit.   Subjective Assessment - 02/02/21 1748     Subjective Pt had to use  the bathroom during session, has been having a lot of stomach issue today    Patient Stated Goals see if there is anything that can help my digestion and be able to empty completely    Currently in Pain? No/denies                               [facility] Adult PT Treatment/Exercise - 02/02/21 0001       Self-Care   Other Self-Care Comments  reviewed information about the dilators      Lumbar Exercises: Standing   Other Standing Lumbar Exercises pallof serier yellow band - 10x each                       PT Short Term Goals - 01/12/21 1405       PT SHORT TERM GOAL #1   Title Pt will report feelingmore complete BMs    Status Achieved      PT SHORT TERM GOAL #2   Title Pt will report ind with initial HEP and toileting techniques    Status Achieved               PT Long Term Goals - 02/02/21 1746       PT LONG TERM GOAL #1   Title Pt will be able to make a week without any smearing or leakage  Status Partially Met      PT LONG TERM GOAL #2   Title Pt will be able to completely empty bowels at one time without waking throughout the night to have a BM    Status Achieved      PT LONG TERM GOAL #3   Title ind with advanced HEP    Status Achieved      PT LONG TERM GOAL #4   Title Ind with vaginal release and relaxation techniques including dilators as needed for less pain during intercourse    Baseline has information but not working on this yet    Status Partially Met                   Plan - 02/02/21 1742     Clinical Impression Statement Pt is doing well with HEP at this time.  She is still dealing with stomach issues but much better withthe exercises.  She is recommended to cont with HEP and will discharge today.    PT Treatment/Interventions ADLs/Self Care Home Management;Biofeedback;Cryotherapy;Moist Heat;Electrical Stimulation;Therapeutic activities;Therapeutic exercise;Neuromuscular re-education;Patient/family  education;Manual techniques;Passive range of motion;Dry needling;Taping    PT Next Visit Plan d/c today    PT Home Exercise Plan Access Code: [email or web address]    Consulted and Agree with Plan of Care Patient             Patient will benefit from skilled therapeutic intervention in order to improve the following deficits and impairments:  Decreased coordination, Increased muscle spasms, Impaired flexibility, Decreased strength, Pain  Visit Diagnosis: Muscle weakness (generalized)  Unspecified lack of coordination     Problem List Patient Active Problem List   Diagnosis Date Noted   Dyssynergic defecation    Incontinence of feces    Hyponatremia 06/29/2020   Mixed hyperlipidemia 05/26/2020   Essential hypertension 05/26/2020   Encounter for loop recorder check 02/14/2019   Loop recorder Medtronic Linq 02/14/2019   Syncope and collapse 01/22/2019   Exertional chest pain 01/22/2019    Jule Ser, PT 02/02/2021, 5:49 PM  [facility] [address] [place], [place], [address] Phone: [phone]   Fax:  [phone]  Name: Mary Leblanc MRN: [id number] Date of Birth: Nov 01, 1940  PHYSICAL THERAPY DISCHARGE SUMMARY  Visits from Start of Care: 6  Current functional level related to goals / functional outcomes: See above goals   Remaining deficits: See above   Education / Equipment: HEP Patient agrees to discharge. Patient goals were partially met. Patient is being discharged due to being pleased with the current functional level.  Gustavus Bryant, PT 02/02/21 5:50 PM

## 2021-02-03 ENCOUNTER — Other Ambulatory Visit: Payer: Medicare Other

## 2021-02-03 DIAGNOSIS — R197 Diarrhea, unspecified: Secondary | ICD-10-CM

## 2021-02-09 LAB — CALPROTECTIN, FECAL: Calprotectin, Fecal: 111 ug/g (ref 0–120)

## 2021-02-10 LAB — PANCREATIC ELASTASE, FECAL: Pancreatic Elastase-1, Stool: 487 mcg/g

## 2021-02-13 NOTE — Telephone Encounter (Signed)
Melissa, please inform the patient the Florastor probiotic will not interfere with the SIBO breath test.  She can complete the SIBO breath test after October 9 th.  Thank you

## 2021-03-01 ENCOUNTER — Other Ambulatory Visit: Payer: Self-pay | Admitting: Cardiology

## 2021-03-01 DIAGNOSIS — E782 Mixed hyperlipidemia: Secondary | ICD-10-CM

## 2021-04-18 IMAGING — CT CT ABD-PELV W/ CM
2 of 5 series · 16 of 46 positions shown, 18 images · IV contrast (APPLIED)
Comparison: None.

CLINICAL DATA: Diarrhea, lower abdominal pain

EXAM:
CT ABDOMEN AND PELVIS WITH CONTRAST
TECHNIQUE: Multidetector CT imaging of the abdomen and pelvis was performed
using the standard protocol following bolus administration of
intravenous contrast.
CONTRAST:  100mL OMNIPAQUE IOHEXOL 300 MG/ML SOLN, additional oral
enteric contrast

[Series 2: axial st · axial · 0.70mm/px · z∈[+1080,+1430]mm · 13 of 82 slices shown, 15 images]
[im 6/82  soft-tissue]
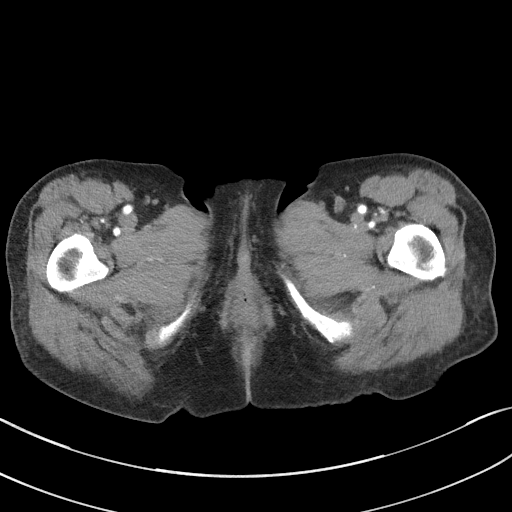
[im 6/82  bone]
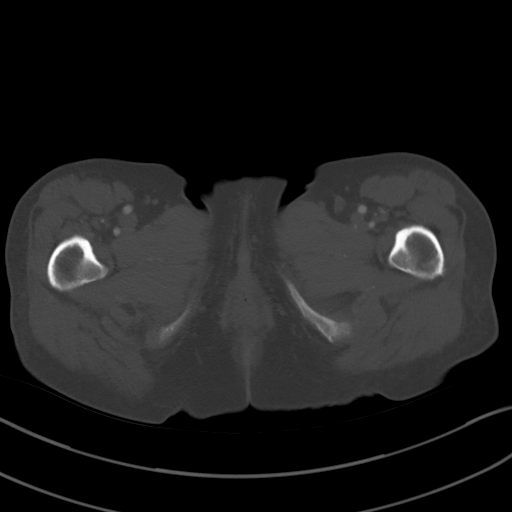
[im 11/82  soft-tissue]
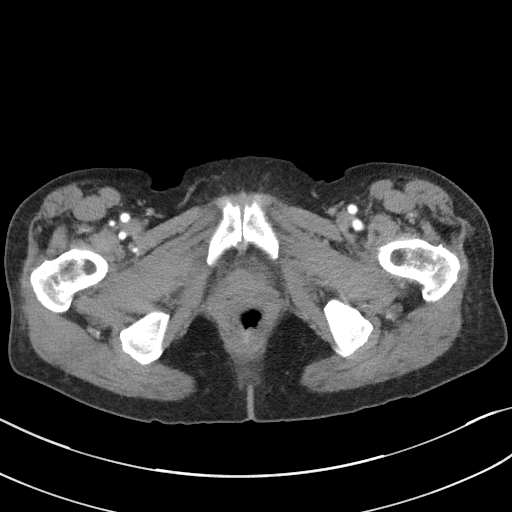
[im 17/82  soft-tissue]
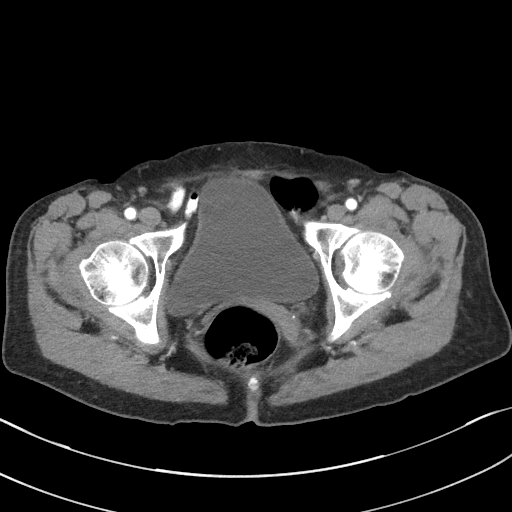
[im 22/82  soft-tissue]
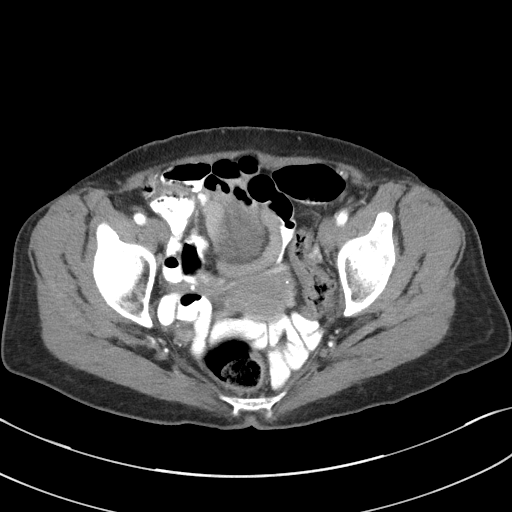
[im 28/82  soft-tissue]
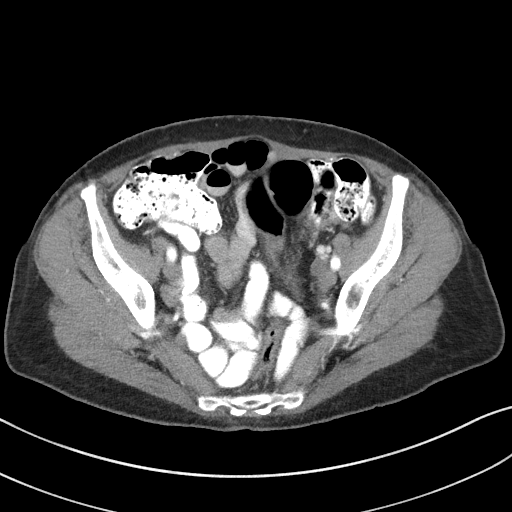
[im 33/82  soft-tissue]
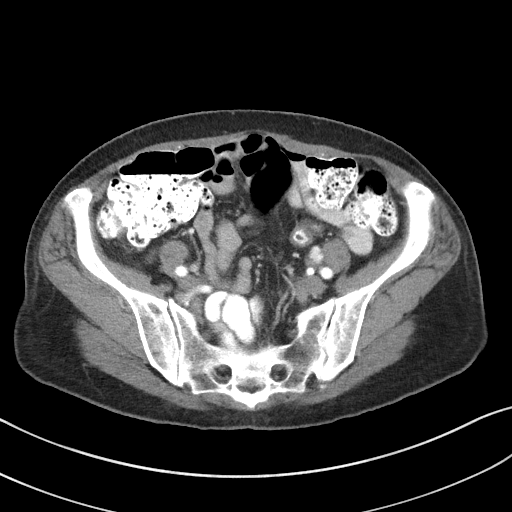
[im 44/82  soft-tissue]
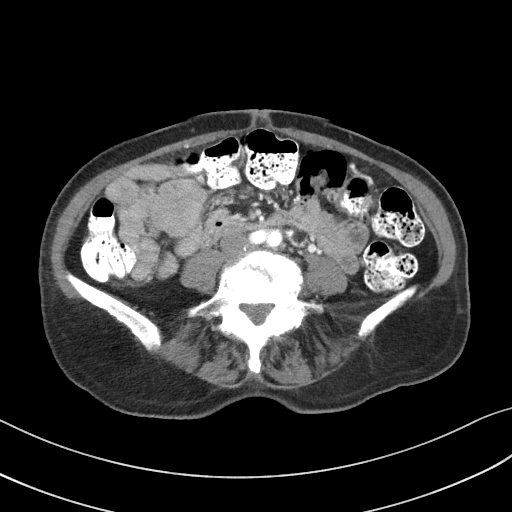
[im 49/82  soft-tissue]
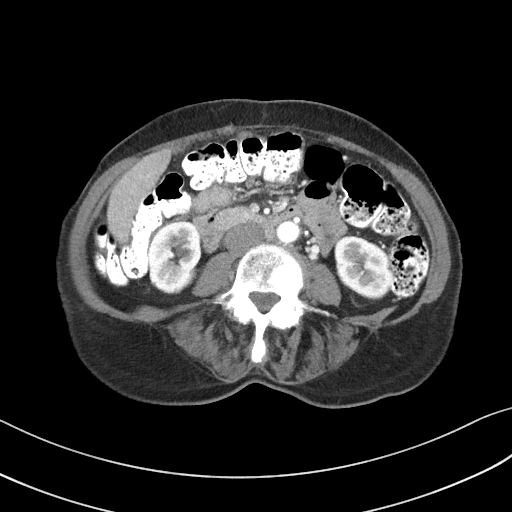
[im 55/82  soft-tissue]
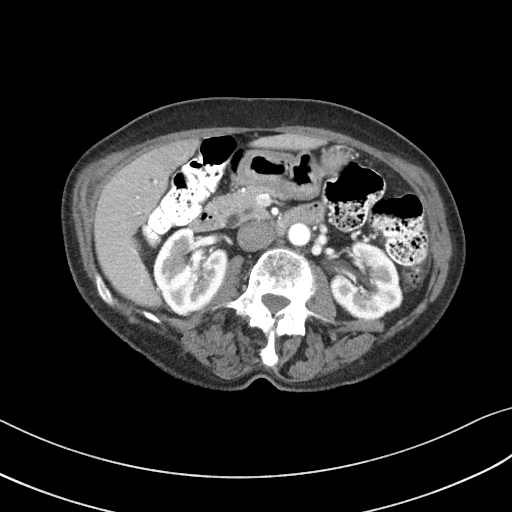
[im 55/82  bone]
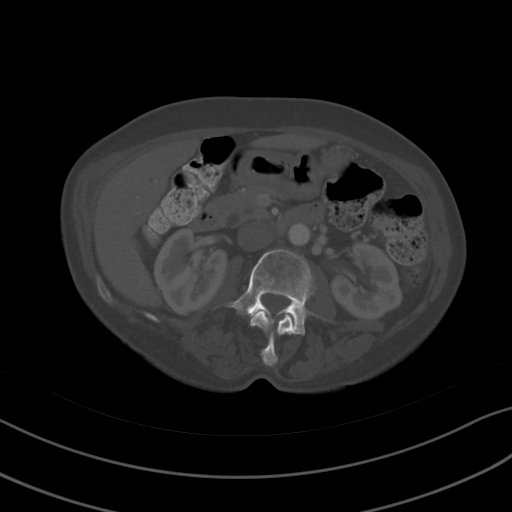
[im 60/82  soft-tissue]
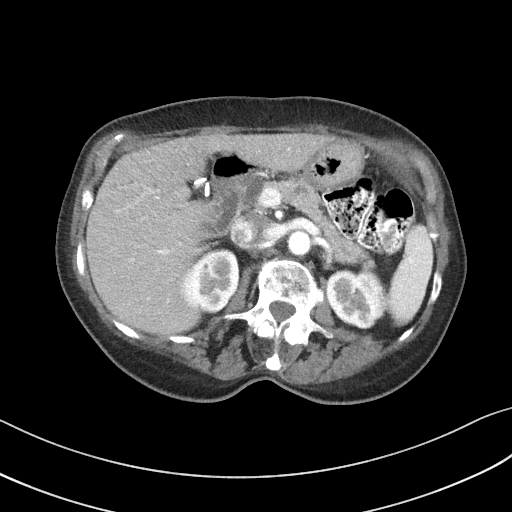
[im 65/82  soft-tissue]
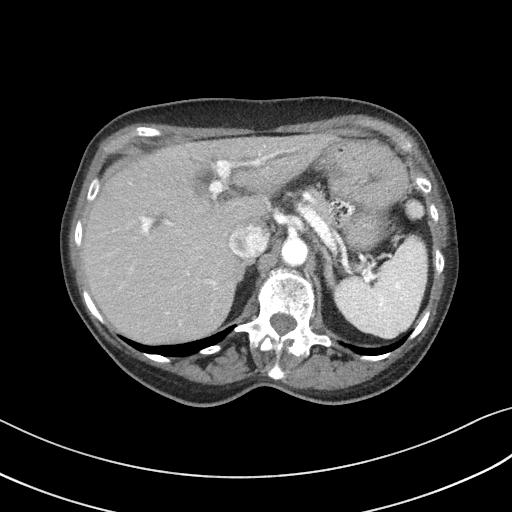
[im 71/82  soft-tissue]
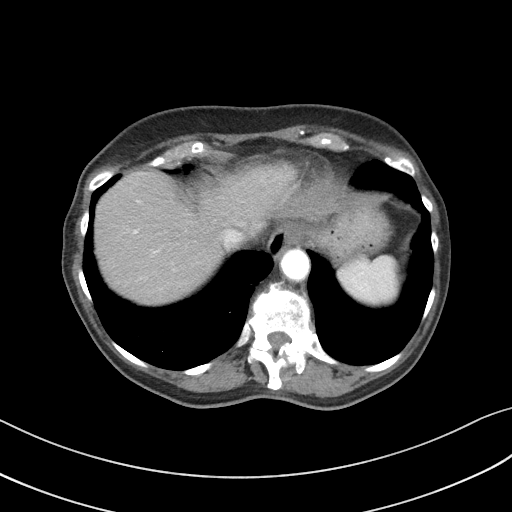
[im 76/82  soft-tissue]
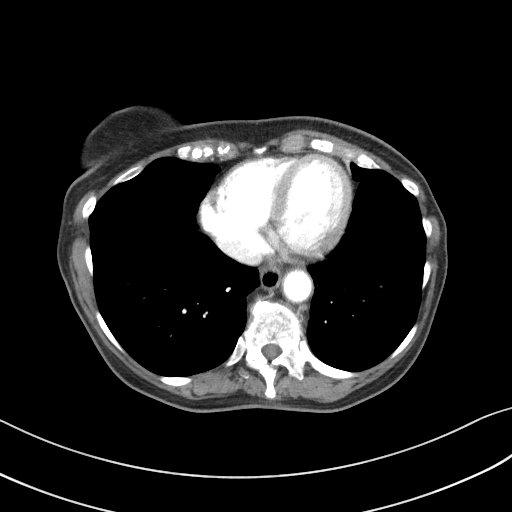

[Series 4: coronal st · coronal · 0.68mm/px · 3 of 85 slices shown]
[im 29/85  soft-tissue]
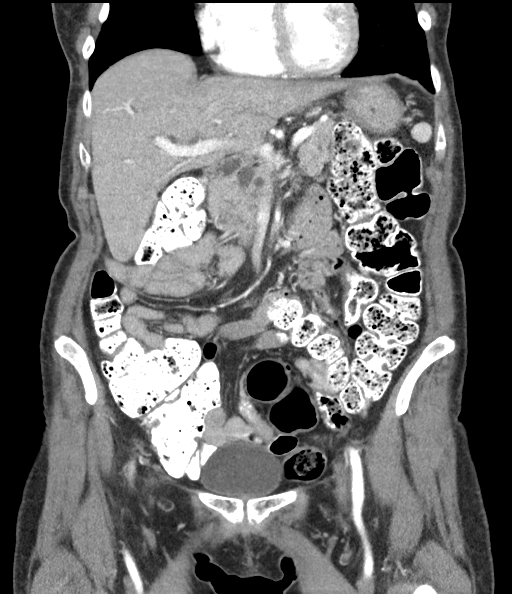
[im 38/85  soft-tissue]
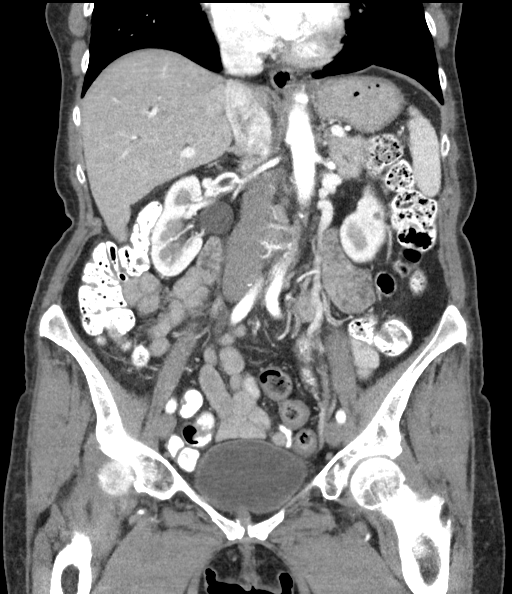
[im 47/85  soft-tissue]
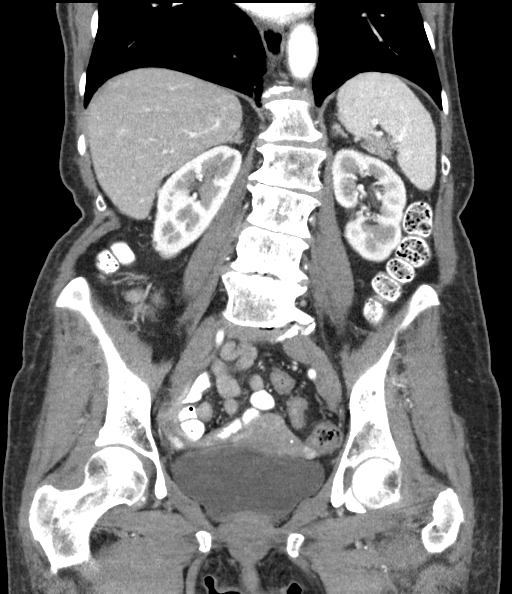

[16 of 46 positions shown; findings below may reference images not displayed]

FINDINGS: Lower chest: No acute abnormality.

Hepatobiliary: No focal liver abnormality is seen. Status post
cholecystectomy. Postoperative biliary dilatation.

Pancreas: The pancreatic duct is diffusely dilated to the ampulla,
measuring up to 6 mm.

Spleen: Normal in size without significant abnormality.

Adrenals/Urinary Tract: Adrenal glands are unremarkable. Kidneys are
normal, without renal calculi, solid lesion, or hydronephrosis.
Bladder is unremarkable.

Stomach/Bowel: Stomach is within normal limits. Appendix is not
clearly visualized and may be surgically absent. No evidence of
bowel wall thickening, distention, or inflammatory changes.

Vascular/Lymphatic: No significant vascular findings are present. No
enlarged abdominal or pelvic lymph nodes.

Reproductive: Calcified uterine fibroids.

Other: No abdominal wall hernia or abnormality. No abdominopelvic
ascites.

Musculoskeletal: No acute or significant osseous findings.
IMPRESSION: 1. No acute CT findings of the abdomen or pelvis to explain diarrhea
or abdominal pain.
2. The pancreatic duct is diffusely dilated to the ampulla,
measuring up to 6 mm. No obvious lesion or other source of
obstruction identified by CT. This is of uncertain significance.
Consider MRCP and/or ERCP to further assess.
3. Status post cholecystectomy. Postoperative biliary dilatation.

## 2021-06-08 ENCOUNTER — Ambulatory Visit (INDEPENDENT_AMBULATORY_CARE_PROVIDER_SITE_OTHER): Payer: Medicare Other | Admitting: Gastroenterology

## 2021-06-08 ENCOUNTER — Other Ambulatory Visit: Payer: Self-pay

## 2021-06-08 ENCOUNTER — Encounter: Payer: Self-pay | Admitting: Gastroenterology

## 2021-06-08 VITALS — BP 110/58 | HR 55 | Ht 60.0 in | Wt 115.1 lb

## 2021-06-08 DIAGNOSIS — R109 Unspecified abdominal pain: Secondary | ICD-10-CM

## 2021-06-08 DIAGNOSIS — K529 Noninfective gastroenteritis and colitis, unspecified: Secondary | ICD-10-CM

## 2021-06-08 DIAGNOSIS — K580 Irritable bowel syndrome with diarrhea: Secondary | ICD-10-CM

## 2021-06-08 DIAGNOSIS — R159 Full incontinence of feces: Secondary | ICD-10-CM

## 2021-06-08 DIAGNOSIS — R140 Abdominal distension (gaseous): Secondary | ICD-10-CM

## 2021-06-08 DIAGNOSIS — K8689 Other specified diseases of pancreas: Secondary | ICD-10-CM

## 2021-06-08 MED ORDER — RIFAXIMIN 550 MG PO TABS: 550.0000 mg | ORAL_TABLET | Freq: Three times a day (TID) | ORAL | 1 refills | Status: AC

## 2021-06-08 NOTE — Patient Instructions (Signed)
If you are age 80 or older, your body mass index should be between 23-30. Your Body mass index is 22.48 kg/m. If this is out of the aforementioned range listed, please consider follow up with your Primary Care Provider.  If you are age 74 or younger, your body mass index should be between 19-25. Your Body mass index is 22.48 kg/m. If this is out of the aformentioned range listed, please consider follow up with your Primary Care Provider.   __________________________________________________________  The [facility] providers would like to encourage you to use [facility] to communicate with providers for non-urgent requests or questions.  Due to long hold times on the telephone, sending your provider a message by [facility] may be a faster and more efficient way to get a response.  Please allow 48 business hours for a response.  Please remember that this is for non-urgent requests.   You have been scheduled for an MRI at [place] on ______. Your appointment time is _________. Please arrive 15 minutes prior to your appointment time for registration purposes. Please make certain not to have anything to eat or drink 6 hours prior to your test. In addition, if you have any metal in your body, have a pacemaker or defibrillator, please be sure to let your ordering physician know. This test typically takes 45 minutes to 1 hour to complete. Should you need to reschedule, please call [phone] to do so.   Due to recent changes in healthcare laws, you may see the results of your imaging and laboratory studies on MyChart before your provider has had a chance to review them.  We understand that in some cases there may be results that are confusing or concerning to you. Not all laboratory results come back in the same time frame and the provider may be waiting for multiple results in order to interpret others.  Please give [place] 48 hours in order for your provider to thoroughly review all the results before  contacting the office for clarification of your results.   We have sent the following medications to your pharmacy for you to pick up at your convenience:   Xifaxin  I have given you 6 days of Xifaxin and will send to your pharmacy to fill the rest. Most times Xifaxin needs to have a prior authorization. We will be in contact with you regarding this.  Please start on probiotics, and attached is a [place] recommended.  Thank you for choosing me and [facility].  Vito Cirigliano, D.O.

## 2021-06-08 NOTE — Progress Notes (Signed)
Chief Complaint:    Diarrhea, abdominal cramping, bloating   GI History: 81 year old female with history of depression, HTN, HLD, remote history of DVT, bradycardia, syncope s/p Loop recorder placement 12/2017, appendectomy, cholecystectomy 2013, and neck surgery follows in the GI clinic for the following:  1) IBS-D.  Episodic lower abdominal pain with nonbloody diarrhea, which can be explosive at times.  Worries about leaving the house for fear of episode. - Has completed 6 or 7 colonoscopies in her lifetime, with a single polyp removed during 1 of those colonoscopies. - 10/15/2017: Colonoscopy at [facility] clinic in [place]: External hemorrhoids, no polyps.  No bxs. Repeat in 5 years - 07/06/2020: CT abdomen/pelvis: Normal GI tract.  PD diffusely dilated to the ampulla measuring 6 mm. Ccy with postoperative biliary dilation - 07/24/2020: MRI/MRCP: Very mild dilatation of main PD and CBD and intrahepatic biliary tree.  No pancreatic head mass, no definite ampullary lesion.  Mild hepatic steatosis - 08/15/2020: ARM: Weak anal sphincter pressures, some evidence of pelvic floor dyssynergia and hyposensitivity.  Referred to [facility] and PT - 09/15/2020: EUS: Hyperechoic stranding in entire pancreas, PD dilated in the pancreatic head, genu, body, and tail with proximal tapering.  Prominent PD sidebranches in body/tail with tortuous/ectatic appearance in genu transition to body.  Dilation of CBD and common hepatic duct.  No ampullary mass.  No malignant appearing lymph nodes.  Recommended repeat MRI/MRCP in 1 year - 02/01/2021: GI follow-up.  Ongoing episodic diarrhea with lower abdominal pain.  Trialed course of Florastor, IBgard.  Discussed changing PPI to H2 blocker. Ordered SIBO breath testing (never completed) - 02/02/2021: Follow-up in PT Clinic. Sxs improved (partially met goals). D/c from PT - 02/03/2021: Fecal calprotectin 111 (borderline), normal pancreatic elastase, TSH, BMP, TTG, IgA.  Calcium 10.6  HPI:      Patient is a 81 y.o. female presenting to the Gastroenterology Clinic for follow-up.  Last seen by Carl Best on 01/24/2021.  Main issue at that time is intermittent lower abdominal pain and nonbloody diarrhea.  Today, states she continues to have intermittent diarrhea, with abdominal crmaping, bloating, and flatus. Now taking Imodium 4 mg/day. Stopped coffee, stopped eating seeds with some improvement. +urgency with episodes of fecal incontinence. +fatigue.    Was seen by her PCM last month. Had recommended referral to Psychology, but she disagrees. Started Lexapro about 9 months ago, and increased dose in last couple months.   Presents to the office today with her husband.  Review of systems:     No chest pain, no SOB, no fevers, no urinary sx   Past Medical History:  Diagnosis Date   Anemia    Depression    Encounter for loop recorder check 02/14/2019   GERD (gastroesophageal reflux disease)    HTN (hypertension)    Hyperlipidemia    Osteopenia    Syncope and collapse 01/22/2019    Patient's surgical history, family medical history, social history, medications and allergies were all reviewed in Epic    Current Outpatient Medications  Medication Sig Dispense Refill   alendronate (FOSAMAX) 70 MG tablet Take 70 mg by mouth once a week. Take with a full glass of water on an empty stomach.     B Complex-C (B-COMPLEX WITH VITAMIN C) tablet Take 1 tablet by mouth daily.     calcium carbonate (OSCAL) 1500 (600 Ca) MG TABS tablet Take 600 mg of elemental calcium by mouth daily with breakfast.     escitalopram (LEXAPRO) 10 MG tablet Take 15  mg by mouth daily.     gabapentin (NEURONTIN) 300 MG capsule Take 600 mg by mouth 3 (three) times daily.     loperamide (IMODIUM) 2 MG capsule Take 8 mg by mouth at bedtime.     melatonin 1 MG TABS tablet Take 1 mg by mouth at bedtime.     omeprazole (PRILOSEC) 20 MG capsule Take 20 mg by mouth daily.     rosuvastatin (CRESTOR) 10 MG  tablet TAKE 1 TABLET BY MOUTH EVERY DAY 90 tablet 1   valsartan (DIOVAN) 80 MG tablet Take 80 mg by mouth daily.     No current facility-administered medications for this visit.    Physical Exam:     There were no vitals taken for this visit.  GENERAL:  Pleasant female in NAD PSYCH: : Cooperative, normal affect Musculoskeletal:  Normal muscle tone, normal strength NEURO: Alert and oriented x 3, no focal neurologic deficits   IMPRESSION and PLAN:    1) Chronic diarrhea 2) IBS-D 3) Abdominal bloating 4) Abdominal cramping  - Trial rifaximin 550 mg tid x14 days, RF 1.  Provided with samples today - Start probiotic and continue at least 4 weeks beyond completion of rifaximin course - Resume low FODMAP diet.  Provided with handout and detailed instructions today - We briefly discussed changing SSRI to TCA pending response to therapy above.  If planning on changing, will communicate with her PCM - Okay to continue Imodium as needed - Can start Bentyl or Levsin pending response to rifaximin - If no response to therapy, plan for EGD and colonoscopy with biopsies - Fiber supplement for stool bulking  5) Fecal incontinence 6) Weak anal sphincter pressures 7) Rectal hyposensitivity - Evaluate for improvement with the above - If no significant improvement, referral to Colorectal Surgery  8) Dilated pancreatic duct - Repeat MRI/MRCP in 09/2021  RTC in 2 months or sooner as needed          Lavena Bullion ,DO, FACG 06/08/2021, 11:29 AM

## 2021-06-21 ENCOUNTER — Telehealth: Payer: Self-pay | Admitting: Gastroenterology

## 2021-06-21 NOTE — Telephone Encounter (Signed)
Inbound call from patient stating that she needed to be seen today with Dr. Bryan Lemma for IBS with diarrhea. I advised patient that we did not have any openings for today and the next day that he had was 2/27. She stated she wanted to speak with the nurse. Please advise.

## 2021-06-21 NOTE — Telephone Encounter (Signed)
Plan for the following: -Complete course of Xifaxan as prescribed - Okay to use Imodium as this has helped in the past and seemingly helping today - Low FODMAP diet - Can start trial of IBgard - Continue p.o. intake, particularly maintain adequate fluid intake - Can try to find sooner appointment with one of the APP's if available for a work-in appt

## 2021-06-21 NOTE — Telephone Encounter (Signed)
Pt reports that she has been taking xifaxan for 8 or 9 days but does not feel like she is getting any better. Pt reports she feels awful and has no energy or appetite. Asked pt if she had any nausea or vomiting and pt reports that she might feel a little queasy but has not vomited. Pt states she is able to eat and drink but just doesn't have an appetite. No fever. Pt unsure how many episodes of diarrhea she has had in 24 hours but states that she knows she went 6 times overnight. Pt reports that she has urgency and is sometimes unable to make it to the bathroom in time. Pt states she just took imodium this morning so is not sure how it will help but hasn't gone since she took it. Please advise.

## 2021-06-21 NOTE — Telephone Encounter (Signed)
Spoke with pt. Gave pt recommendations.  Pt is scheduled for an appt with Quentin Mulling, PA on 06/22/21 at 1:30 pm. Pt verbalized understanding and had no other concerns at end of call.

## 2021-06-21 NOTE — Progress Notes (Signed)
06/22/2021 Mary Leblanc [id number] [birth year]   ASSESSMENT AND PLAN:   Chronic diarrhea -     GI Profile, Stool, PCR; Future -     Clostridium difficile Toxin B, Qualitative, Real-Time PCR; Future -     colestipol (COLESTID) 1 g tablet; Take 1 tablet (1 g total) by mouth 2 (two) times daily. - long standing history multiple colonoscopies, negative pancreatitic elastase, negative celiac.  - will do trial of colestid with history of choley and some yellow/floating stools - if labs negative and colestid does not help, agree with discussing with PCP switching of SSRI to TCA to see if this helps.  Dilated pancreatic duct Need repeat MRCP 09/2021   Irritable bowel syndrome with diarrhea Long standing history  Fever, unspecified -     GI Profile, Stool, PCR; Future -     Clostridium difficile Toxin B, Qualitative, Real-Time PCR; Future -     CBC with Differential/Platelet; Future -     Comprehensive metabolic panel; Future -     C-reactive protein; Future ? Viral illness, will check CBC/CMET, CRP, stool pathogens    Future Appointments  Date Time Provider Department Center  01/26/2022  8:30 AM Patwardhan, Anabel Bene, MD [facility] None    History of Present Illness:  81 y.o. female  with a past medical history of hypertension, hyperlipidemia, exertional chest pain, remote history of DVT, bradycardia status post loop recorder 12/2017, appendectomy, cholecystectomy 2013 and neck surgery, IBS-D, bowel incontinence and others listed below, known to Dr. Barron Alvine returns to clinic today for evaluation of diarrhea and lower abdominal discomfort.  Patient's had very thorough examination with most recent colonoscopy 2019, recall 5 years. CT abdomen pelvis 07/2020 postoperative biliary dilatation, pancreatic duct diffusely dilated 6 mm,-had subsequent MRCP 07/24/2020 showed dilated pancreatic duct, EUS 09/15/2020 showed stranding in entire pancreas and PD dilated, but no masses.   Repeat MRI  MRCP recommeneded in 1 year-patient due 09/2021 Patient had pelvic floor physical therapy, discharge 02/02/2021 meeting goals. Borderline fecal calprotectin, normal pancreatic elastase, thyroid, calcium, negative celiac 02/03/2021  Trailed course of Florastor, IBgard.   Discussed changing PPI to H2 blocker.  SIBO testing never completed but given trial of Xifaxan with samples. On low FODMAP diet Brief discussion changing SSRI to TCA last visit.  Patient called 06/21/2020 decreased appetite, fatigue, queasiness but no vomiting.  Denies fever.  6 times overnight diarrhea urgency with fecal incontinence.  2 nights ago had diarrhea through the night, done yesterday or today after taking 6 mg of imodium.  She continues to have fecal incontinence, urgency, bloating, flatulu.ss.  She has felt very tired, has not felt weak. Has had low grade temp with chills.  She had a productive cough last week, negative flu/COVID 06/16/21, she took a zpak but started with diarrhea so stopped, only took 3 days.  She has had decreased appetite, trying FODMAP, probiotic, was on xifaxin.  No sick contacts.   Previous GI history: 08/2020 had AROM weak anal sphincter pressures pelvic floor dyssynergy, EUS showed hyperechoic stranding entire pancreas - 10/15/2017: Colonoscopy at [facility] clinic in [place]: External hemorrhoids, no polyps.  No bxs. Repeat in 5 years - 07/06/2020: CT abdomen/pelvis: Normal GI tract.  PD diffusely dilated to the ampulla measuring 6 mm. Ccy with postoperative biliary dilation - 07/24/2020: MRI/MRCP: Very mild dilatation of main PD and CBD and intrahepatic biliary tree.  No pancreatic head mass, no definite ampullary lesion.  Mild hepatic steatosis - 08/15/2020: ARM: Weak anal sphincter pressures,  some evidence of pelvic floor dyssynergia and hyposensitivity.  Referred to CCS and PT - 09/15/2020: EUS: Hyperechoic stranding in entire pancreas, PD dilated in the pancreatic head, genu, body, and  tail with proximal tapering.  Prominent PD sidebranches in body/tail with tortuous/ectatic appearance in genu transition to body.  Dilation of CBD and common hepatic duct.  No ampullary mass.  No malignant appearing lymph nodes.  Recommended repeat MRI/MRCP in 1 year  CBC  12/16/2020  HGB 13.0 MCV 95.6 without evidence of anemia WBC 4.0 Platelets 188 Kidney function 02/01/2021  BUN 20 Cr 1.08  GFR 47.9  Potassium 4.3   LFTs 12/16/2020  AST 38 ALT 38 Alkphos 40 TBili 0.8 02/01/2021 TSH 1.25   No results found.  Current Medications:    Current Outpatient Medications (Cardiovascular):    colestipol (COLESTID) 1 g tablet, Take 1 tablet (1 g total) by mouth 2 (two) times daily.   rosuvastatin (CRESTOR) 10 MG tablet, TAKE 1 TABLET BY MOUTH EVERY DAY   valsartan (DIOVAN) 80 MG tablet, Take 80 mg by mouth daily.     Current Outpatient Medications (Other):    B Complex-C (B-COMPLEX WITH VITAMIN C) tablet, Take 1 tablet by mouth daily.   calcium carbonate (OSCAL) 1500 (600 Ca) MG TABS tablet, Take 600 mg of elemental calcium by mouth daily with breakfast.   escitalopram (LEXAPRO) 20 MG tablet, Take 20 mg by mouth daily.   loperamide (IMODIUM) 2 MG capsule, Take 4 mg by mouth at bedtime.   omeprazole (PRILOSEC) 20 MG capsule, Take 20 mg by mouth daily.   Probiotic Product (PROBIOTIC PO), Take 1 capsule by mouth daily.   rifaximin (XIFAXAN) 550 MG TABS tablet, Take 1 tablet (550 mg total) by mouth 3 (three) times daily for 14 days.  Surgical History:  She  has a past surgical history that includes Appendectomy (2000); Cholecystectomy (2013); Cervical laminoplasty (2002); Anal Rectal manometry (N/A, 08/15/2020); EUS (N/A, 09/15/2020); biopsy (09/15/2020); and Esophagogastroduodenoscopy (egd) with propofol (N/A, 09/15/2020). Family History:  Her family history includes Clotting disorder in her father; Diabetes in her brother, father, and mother; Heart Problems (age of onset: 27) in her paternal  grandfather; Heart attack in her father; Heart disease in her maternal grandmother and paternal grandmother; Hypertension in her mother; Kidney cancer in her father; Liver cancer in her maternal grandfather; Non-Hodgkin's lymphoma in her sister; Prostate cancer in her father; Stroke in her mother; Thyroid cancer in her mother. Social History:   reports that she quit smoking about 55 years ago. Her smoking use included cigarettes. She has a 10.00 pack-year smoking history. She has never used smokeless tobacco. She reports current alcohol use. She reports that she does not currently use drugs.  Current Medications, Allergies, Past Medical History, Past Surgical History, Family History and Social History were reviewed in [employer] record.  Physical Exam: BP (!) 120/40 (BP Location: Left Arm, Patient Position: Sitting, Cuff Size: Normal)    Pulse 68    Ht 5' (1.524 m)    Wt 109 lb 2 oz (49.5 kg)    BMI 21.31 kg/m  General:   Pleasant, well developed female in no acute distress Eyes: sclerae anicteric,conjunctive pink  Heart:  regular rate and rhythm Pulm: Clear anteriorly; no wheezing Abdomen:  Soft, Flat AB, skin exam normal, Normal bowel sounds. mild tenderness in the LLQ. Without guarding and Without rebound, without hepatomegaly. Extremities:  Without edema. Peripheral pulses intact.  Neurologic:  Alert and  oriented x4;  grossly  normal neurologically. Skin:   Dry and intact without significant lesions or rashes. Psychiatric: Demonstrates good judgement and reason without abnormal affect or behaviors.  Vladimir Crofts, PA-C 06/22/21

## 2021-06-22 ENCOUNTER — Other Ambulatory Visit (INDEPENDENT_AMBULATORY_CARE_PROVIDER_SITE_OTHER): Payer: Medicare Other

## 2021-06-22 ENCOUNTER — Encounter: Payer: Self-pay | Admitting: Physician Assistant

## 2021-06-22 ENCOUNTER — Ambulatory Visit (INDEPENDENT_AMBULATORY_CARE_PROVIDER_SITE_OTHER): Payer: Medicare Other | Admitting: Physician Assistant

## 2021-06-22 VITALS — BP 120/40 | HR 68 | Ht 60.0 in | Wt 109.1 lb

## 2021-06-22 DIAGNOSIS — K529 Noninfective gastroenteritis and colitis, unspecified: Secondary | ICD-10-CM | POA: Diagnosis not present

## 2021-06-22 DIAGNOSIS — R509 Fever, unspecified: Secondary | ICD-10-CM | POA: Diagnosis not present

## 2021-06-22 DIAGNOSIS — K8689 Other specified diseases of pancreas: Secondary | ICD-10-CM

## 2021-06-22 DIAGNOSIS — K580 Irritable bowel syndrome with diarrhea: Secondary | ICD-10-CM

## 2021-06-22 LAB — COMPREHENSIVE METABOLIC PANEL
ALT: 65 U/L — ABNORMAL HIGH (ref 0–35)
AST: 32 U/L (ref 0–37)
Albumin: 4.9 g/dL (ref 3.5–5.2)
Alkaline Phosphatase: 40 U/L (ref 39–117)
BUN: 17 mg/dL (ref 6–23)
CO2: 30 mEq/L (ref 19–32)
Calcium: 10.2 mg/dL (ref 8.4–10.5)
Chloride: 102 mEq/L (ref 96–112)
Creatinine, Ser: 1.06 mg/dL (ref 0.40–1.20)
GFR: 49.82 mL/min — ABNORMAL LOW (ref >60.00)
Glucose, Bld: 82 mg/dL (ref 70–99)
Potassium: 4.6 mEq/L (ref 3.5–5.1)
Sodium: 137 mEq/L (ref 135–145)
Total Bilirubin: 0.7 mg/dL (ref 0.2–1.2)
Total Protein: 7.9 g/dL (ref 6.0–8.3)

## 2021-06-22 LAB — CBC WITH DIFFERENTIAL/PLATELET
Basophils Absolute: 0 10*3/uL (ref 0.0–0.1)
Basophils Relative: 0.4 % (ref 0.0–3.0)
Eosinophils Absolute: 0 10*3/uL (ref 0.0–0.7)
Eosinophils Relative: 0.7 % (ref 0.0–5.0)
HCT: 36.9 % (ref 36.0–46.0)
Hemoglobin: 12.4 g/dL (ref 12.0–15.0)
Lymphocytes Relative: 34.7 % (ref 12.0–46.0)
Lymphs Abs: 2.2 10*3/uL (ref 0.7–4.0)
MCHC: 33.7 g/dL (ref 30.0–36.0)
MCV: 95.6 fl (ref 78.0–100.0)
Monocytes Absolute: 0.3 10*3/uL (ref 0.1–1.0)
Monocytes Relative: 5 % (ref 3.0–12.0)
Neutro Abs: 3.8 10*3/uL (ref 1.4–7.7)
Neutrophils Relative %: 59.2 % (ref 43.0–77.0)
Platelets: 210 10*3/uL (ref 150.0–400.0)
RBC: 3.86 Mil/uL — ABNORMAL LOW (ref 3.87–5.11)
RDW: 13 % (ref 11.5–15.5)
WBC: 6.4 10*3/uL (ref 4.0–10.5)

## 2021-06-22 LAB — C-REACTIVE PROTEIN: CRP: <1 mg/dL (ref 0.5–20.0)

## 2021-06-22 MED ORDER — COLESTIPOL HCL 1 G PO TABS: 1.0000 g | ORAL_TABLET | Freq: Two times a day (BID) | ORAL | 0 refills | Status: DC

## 2021-06-22 NOTE — Patient Instructions (Addendum)
If you are age 80 or older, your body mass index should be between 23-30. Your Body mass index is 21.31 kg/m. If this is out of the aforementioned range listed, please consider follow up with your Primary Care Provider. ________________________________________________________  The [facility] providers would like to encourage you to use [facility] to communicate with providers for non-urgent requests or questions.  Due to long hold times on the telephone, sending your provider a message by [facility] may be a faster and more efficient way to get a response.  Please allow 48 business hours for a response.  Please remember that this is for non-urgent requests.  _______________________________________________________  Your provider has requested that you go to the basement level for lab work before leaving today. Press "B" on the elevator. The lab is located at the first door on the left as you exit the elevator.  Due to recent changes in healthcare laws, you may see the results of your imaging and laboratory studies on MyChart before your provider has had a chance to review them.  We understand that in some cases there may be results that are confusing or concerning to you. Not all laboratory results come back in the same time frame and the provider may be waiting for multiple results in order to interpret others.  Please give [place] 48 hours in order for your provider to thoroughly review all the results before contacting the office for clarification of your results.   We have sent the following medications to your pharmacy for you to pick up at your convenience: Will do trial of colestid, this helps bind to bile acid.    Normally well tolerated medication, can cause constipation so start once a day and can take up to twice a day.  It can cause bloating so if that happens can stop but otherwise it is a well tolerated medication.   Call the office or go to the ER if bloody stools, persistent diarrhea, not peeing  regularly, unable to take oral fluids, vomiting, high fever, severe weakness, abdominal pain or failure to improve in 3 days.  You are scheduled to follow up on 08-17-21 at 11:20am with Dr Bryan Lemma in our [facility] office.  Thank you for entrusting me with your care and choosing [facility].  Vicie Mutters, PA-C

## 2021-06-23 ENCOUNTER — Other Ambulatory Visit: Payer: Medicare Other

## 2021-06-23 ENCOUNTER — Encounter: Payer: Self-pay | Admitting: Gastroenterology

## 2021-06-23 ENCOUNTER — Other Ambulatory Visit: Payer: Self-pay

## 2021-06-23 DIAGNOSIS — R7402 Elevation of levels of lactic acid dehydrogenase (LDH): Secondary | ICD-10-CM

## 2021-06-23 DIAGNOSIS — K529 Noninfective gastroenteritis and colitis, unspecified: Secondary | ICD-10-CM

## 2021-06-23 DIAGNOSIS — K8689 Other specified diseases of pancreas: Secondary | ICD-10-CM

## 2021-06-23 DIAGNOSIS — R509 Fever, unspecified: Secondary | ICD-10-CM

## 2021-06-23 DIAGNOSIS — R7401 Elevation of levels of liver transaminase levels: Secondary | ICD-10-CM

## 2021-06-23 DIAGNOSIS — R7989 Other specified abnormal findings of blood chemistry: Secondary | ICD-10-CM

## 2021-06-24 LAB — CLOSTRIDIUM DIFFICILE TOXIN B, QUALITATIVE, REAL-TIME PCR: Toxigenic C. Difficile by PCR: NOT DETECTED

## 2021-06-26 LAB — GI PROFILE, STOOL, PCR

## 2021-06-27 ENCOUNTER — Telehealth: Payer: Self-pay | Admitting: Physician Assistant

## 2021-06-27 LAB — EXTRA SPECIMEN

## 2021-06-27 LAB — HEPATITIS PANEL, ACUTE

## 2021-06-27 NOTE — Telephone Encounter (Signed)
Called lab and requested acute hep panel be added on to previously drawn labs. Lab add on sheet also faxed to lab with confirmation received.

## 2021-06-27 NOTE — Telephone Encounter (Signed)
Patient called and wanted to inform you that she was unable to do the Hepatitis testing you wanted her to do due to the lab having no serum.

## 2021-06-27 NOTE — Telephone Encounter (Signed)
Extra Specimen Order: [id number] Status: Final result    Visible to patient: Yes (not seen)    Next appt: 07/06/2021 at 08:00 AM in Radiology ([facility])    1 Result Note   1 Patient Communication    Component 4 d ago  Extra tube recieved   Comment: An extra specimen was received with no test requested.  The specimen will be maintained in storage in case  additional testing is needed. Please call the client  service department for further assistance.  Marland Kitchen   Specimen type recieved Green Top   Resulting Agency [facility]    Called [facility] lab and spoke with Clydie Braun. States extra serum WAS in fact sent to [facility] in a "gold top" tube for add on Acute Hep Panel.   Called [facility] and spoke with Synetta Fail to inquire further about this add on test. States they received a "green top" tube and NOT a "gold top" tube as [facility] indicated. This resulted in their inability to run the add on test d/t "green top" containing Lithium.   Immediately called Clydie Braun @ [address] and spoke with Clydie Braun. Informed her of the above conversation. Asked that I call the pt to inform of need for re-draw and that she would assure pt is NOT charged for re-draw given the error.  Immediately called pt and made her aware of this error and need for re-draw. States she will proceed to Lab tomorrow for re-draw. Will continue to await results.

## 2021-06-28 ENCOUNTER — Other Ambulatory Visit: Payer: Medicare Other

## 2021-06-28 DIAGNOSIS — R7402 Elevation of levels of liver transaminase levels: Secondary | ICD-10-CM

## 2021-06-28 DIAGNOSIS — R7401 Elevation of levels of liver transaminase levels: Secondary | ICD-10-CM

## 2021-06-28 NOTE — Progress Notes (Signed)
Agree with the assessment and plan as outlined by Amanda Collier, PA-C. ? ?Sabatino Williard, DO, FACG ? ?

## 2021-06-29 LAB — HEPATITIS PANEL, ACUTE
Hep A IgM: NONREACTIVE
Hep B C IgM: NONREACTIVE
Hepatitis B Surface Ag: NONREACTIVE
Hepatitis C Ab: NONREACTIVE
SIGNAL TO CUT-OFF: <0.02 (ref <1.00)

## 2021-07-06 ENCOUNTER — Other Ambulatory Visit: Payer: Self-pay | Admitting: Physician Assistant

## 2021-07-06 ENCOUNTER — Other Ambulatory Visit: Payer: Self-pay

## 2021-07-06 ENCOUNTER — Ambulatory Visit (HOSPITAL_COMMUNITY)
Admission: RE | Admit: 2021-07-06 | Discharge: 2021-07-06 | Disposition: A | Discharge status: Home / Self Care | Payer: Medicare Other | Source: Ambulatory Visit | Attending: Physician Assistant | Admitting: Physician Assistant

## 2021-07-06 DIAGNOSIS — R7989 Other specified abnormal findings of blood chemistry: Secondary | ICD-10-CM | POA: Diagnosis present

## 2021-07-06 DIAGNOSIS — K8689 Other specified diseases of pancreas: Secondary | ICD-10-CM | POA: Insufficient documentation

## 2021-07-06 MED ORDER — GADOBUTROL 1 MMOL/ML IV SOLN
5.0000 mL | Freq: Once | INTRAVENOUS | Status: AC | PRN
  Administered 2021-07-06: 5 mL via INTRAVENOUS

## 2021-07-12 ENCOUNTER — Encounter: Payer: Self-pay | Admitting: Gastroenterology

## 2021-07-13 ENCOUNTER — Other Ambulatory Visit: Payer: Self-pay

## 2021-07-13 DIAGNOSIS — K529 Noninfective gastroenteritis and colitis, unspecified: Secondary | ICD-10-CM

## 2021-07-13 MED ORDER — COLESTIPOL HCL 1 G PO TABS: 1.0000 g | ORAL_TABLET | Freq: Two times a day (BID) | ORAL | 2 refills | Status: DC

## 2021-07-14 ENCOUNTER — Encounter: Payer: Self-pay | Admitting: Gastroenterology

## 2021-07-29 ENCOUNTER — Encounter: Payer: Self-pay | Admitting: Cardiology

## 2021-08-17 ENCOUNTER — Ambulatory Visit: Payer: Medicare Other | Admitting: Gastroenterology

## 2021-08-23 ENCOUNTER — Ambulatory Visit (INDEPENDENT_AMBULATORY_CARE_PROVIDER_SITE_OTHER): Payer: Medicare Other | Admitting: Gastroenterology

## 2021-08-23 ENCOUNTER — Encounter: Payer: Self-pay | Admitting: Gastroenterology

## 2021-08-23 VITALS — BP 92/64 | HR 59 | Ht 60.0 in | Wt 107.2 lb

## 2021-08-23 DIAGNOSIS — K8689 Other specified diseases of pancreas: Secondary | ICD-10-CM

## 2021-08-23 DIAGNOSIS — K862 Cyst of pancreas: Secondary | ICD-10-CM

## 2021-08-23 DIAGNOSIS — K580 Irritable bowel syndrome with diarrhea: Secondary | ICD-10-CM | POA: Diagnosis not present

## 2021-08-23 NOTE — Patient Instructions (Signed)
If you are age 80 or older, your body mass index should be between 23-30. Your Body mass index is 20.95 kg/m?Marland Kitchen If this is out of the aforementioned range listed, please consider follow up with your Primary Care Provider. ? ?__________________________________________________________ ? ?The [place] [facility] providers would like to encourage you to use [facility] to communicate with providers for non-urgent requests or questions.  Due to long hold times on the telephone, sending your provider a message by [facility] may be a faster and more efficient way to get a response.  Please allow 48 business hours for a response.  Please remember that this is for non-urgent requests.  ? ?Due to recent changes in healthcare laws, you may see the results of your imaging and laboratory studies on MyChart before your provider has had a chance to review them.  We understand that in some cases there may be results that are confusing or concerning to you. Not all laboratory results come back in the same time frame and the provider may be waiting for multiple results in order to interpret others.  Please give [place] 48 hours in order for your provider to thoroughly review all the results before contacting the office for clarification of your results.  ? ? ? ?Thank you for choosing me and [facility]. ? ?Doristine Locks, D.O. ? ? ? ?We want to thank you for trusting [facility] with your care. All of our staff and providers value the relationships we have built with our patients, and it is an honor to care for you.  ? ?We are writing to let you know that [facility] will close on Sep 18, 2021, and we invite you to continue to see Dr. Edman Circle and Doristine Locks at the [facility] office location. We are consolidating our serices at these [facility] practices to better provide care. Our office staff will work with you to ensure a seamless transition.  ? ?Doristine Locks, DO  -Dr. Barron Alvine will be movig to [facility] at [address] [place], [place] [address], effective Sep 18, 2021.  Contact [phone] to schedule an appointment with him.  ? ?Edman Circle, MD- Dr. Chales Abrahams will be movig to [facility] at [address], [place], [place] [address], effective Sep 18, 2021.  Contact [phone] to schedule an appointment with him.  ? ?Requesting Medical Records ?If you need to request your medical records, please follow the instructions below. Your medical records are confidential, and a copy can be transferred to another provider or released to you or another person you designate only with your permission. ? ?There are several ways to request your medical records: ?Requests for medical records can be submitted through our practice.   ?You can also request your records electronically, in your MyChart account by selecting the ?Request Health Records? tab.  ?If you need additional information on how to request records, please go to [email or web address], choose Patient Information, then select Request Medical Records. ?To make an appointment or if you have any questions about your health care needs, please contact our office at [phone] and one of our staff members will be glad to assist you. ?[facility] is committed to providing exceptional care for you and our community. Thank you for allowing [place] to serve your health care needs. ?Sincerely, ? ?Trixie Dredge, Director [facility] ?[facility] also offers convenient virtual care options. Sore throat? Sinus problems? Cold or flu symptoms? Get care from the comfort of  home with [facility] Video Visits and e-Visits. Learn more about the non-emergency conditions treated and start your virtual visit at [email or web address] ? ? ?

## 2021-08-23 NOTE — Progress Notes (Signed)
? ?Chief Complaint:    IBS-D ? ?GI History: 81 yo with a past medical history of hypertension, hyperlipidemia, exertional chest pain, remote history of DVT, bradycardia status post loop recorder 12/2017, appendectomy, cholecystectomy 2013 and neck surgery, IBS-D, bowel incontinence  ? ?1) IBS-D.  Episodic lower abdominal pain with nonbloody diarrhea, which can be explosive at times.  Worries about leaving the house for fear of episode. ?- Has completed 6 or 7 colonoscopies in her lifetime, with a single polyp removed during 1 of those colonoscopies. ?- 10/15/2017: Colonoscopy at [facility] clinic in [place]: External hemorrhoids, no polyps.  No bxs. Repeat in 5 years ?- 07/06/2020: CT abdomen/pelvis: Normal GI tract.  PD diffusely dilated to the ampulla measuring 6 mm. Ccy with postoperative biliary dilation ?- 07/24/2020: MRI/MRCP: Very mild dilatation of main PD and CBD and intrahepatic biliary tree.  No pancreatic head mass, no definite ampullary lesion.  Mild hepatic steatosis ?- 08/15/2020: ARM: Weak anal sphincter pressures, some evidence of pelvic floor dyssynergia and hyposensitivity.  Referred to CCS and PT ?- 09/15/2020: EUS: Hyperechoic stranding in entire pancreas, PD dilated in the pancreatic head, genu, body, and tail with proximal tapering.  Prominent PD sidebranches in body/tail with tortuous/ectatic appearance in genu transition to body.  Dilation of CBD and common hepatic duct.  No ampullary mass.  No malignant appearing lymph nodes.  Recommended repeat MRI/MRCP in 1 year ?- 02/01/2021: GI follow-up.  Ongoing episodic diarrhea with lower abdominal pain.  Trialed course of Florastor, IBgard.  Discussed changing PPI to H2 blocker. Ordered SIBO breath testing (never completed) ?- 02/02/2021: Follow-up in PT Clinic. Sxs improved (partially met goals). D/c from PT ?- 02/03/2021: Fecal calprotectin 111 (borderline), normal pancreatic elastase, TSH, BMP, TTG, IgA.  Calcium 10.6 ?- 06/08/2021: Follow-up in GI clinic.   Continued intermittent diarrhea, cramping, bloating.  Started rifaximin and probiotics with continued low FODMAP diet.  Discussed referral to Colorectal Surgery for week anal sphincter pressure. ?- 06/22/2021: Follow-up in GI clinic.  Continued fecal incontinence with urgency, bloating, increased flatus.  Started colestipol.  Again discussed potentially switching SSRI to TCA, which would be done in concert with PCM.  Ordered repeat MRCP.  Negative/normal GI PCR panel, C. difficile, CBC.  ALT 65, otherwise normal CMP.  Normal CRP, acute viral hep panel. ?- 07/06/2021: MRI abdomen: Normal liver, CBD.  Stable mild dilatation of PD measuring 4 mm in HOP without obstructing mass.  9 mm exophytic cyst in tail of pancreas which appears to communicate with PD.  Most likely sidebranch IPMN.  Recommend pre-/postcontrast MRI/MRCP in 2 years. ? ?HPI:   ? ? ?Patient is a 81 y.o. female presenting to the Gastroenterology Clinic for follow-up.  ? ?Has noticed improvement since starting colestipol. Stools are more formed. Following low-FODMAP diet. Uses imodium less frequently now, just prn.  ? ?Patient had her records sent to Dr. Court Joy at [place].  Records were reviewed and agrees with diagnosis of IBS-D and recommends continued treatment here. ? ?Review of systems:     No chest pain, no SOB, no fevers, no urinary sx  ? ?Past Medical History:  ?Diagnosis Date  ? Anemia   ? Depression   ? Encounter for loop recorder check 02/14/2019  ? GERD (gastroesophageal reflux disease)   ? HTN (hypertension)   ? Hyperlipidemia   ? Osteopenia   ? Syncope and collapse 01/22/2019  ? ? ?Patient's surgical history, family medical history, social history, medications and allergies were all reviewed in Epic  ? ? ?  Current Outpatient Medications  ?Medication Sig Dispense Refill  ? colestipol (COLESTID) 1 g tablet Take 1 tablet (1 g total) by mouth 2 (two) times daily. 60 tablet 2  ? escitalopram (LEXAPRO) 20 MG tablet Take 20 mg by mouth daily.     ? loperamide (IMODIUM) 2 MG capsule Take 4 mg by mouth at bedtime.    ? omeprazole (PRILOSEC) 20 MG capsule Take 20 mg by mouth daily.    ? Probiotic Product (PROBIOTIC PO) Take 1 capsule by mouth daily.    ? valsartan (DIOVAN) 80 MG tablet Take 80 mg by mouth daily.    ? rosuvastatin (CRESTOR) 10 MG tablet TAKE 1 TABLET BY MOUTH EVERY DAY 90 tablet 1  ? ?No current facility-administered medications for this visit.  ? ? ?Physical Exam:   ? ? ?BP 92/64 (BP Location: Left Arm, Patient Position: Sitting, Cuff Size: Normal)   Pulse (!) 59   Ht 5' (1.524 m)   Wt 107 lb 4 oz (48.6 kg)   SpO2 97%   BMI 20.95 kg/m?  ? ?GENERAL:  Pleasant female in NAD ?PSYCH: : Cooperative, normal affect ?Musculoskeletal:  Normal muscle tone, normal strength ?NEURO: Alert and oriented x 3, no focal neurologic deficits ? ? ?IMPRESSION and PLAN:   ? ?1) IBS-D ?- Symptoms improved since starting colestipol ?- Continue colestipol ?- Okay to continue Imodium prn ?- Continue low FODMAP diet ? ?2) Pancreatic cyst ?3) Mildly dilated pancreatic duct ?- Pre and postcontrast MRI/MRCP in 07/2023 ? ?4) Weak anal sphincter pressure ?5) Rectal hyposensitivity ?- If return of incontinence symptoms, referral to Colorectal Surgery ?    ?RTC as needed ? ?    ? ?Lavena Bullion ,DO, FACG 08/23/2021, 10:10 AM ? ?

## 2021-09-01 ENCOUNTER — Emergency Department (HOSPITAL_BASED_OUTPATIENT_CLINIC_OR_DEPARTMENT_OTHER): Payer: Medicare Other

## 2021-09-01 ENCOUNTER — Other Ambulatory Visit: Payer: Self-pay

## 2021-09-01 ENCOUNTER — Emergency Department (HOSPITAL_BASED_OUTPATIENT_CLINIC_OR_DEPARTMENT_OTHER): Payer: Medicare Other | Admitting: Radiology

## 2021-09-01 ENCOUNTER — Emergency Department (HOSPITAL_BASED_OUTPATIENT_CLINIC_OR_DEPARTMENT_OTHER)
Admission: EM | Admit: 2021-09-01 | Discharge: 2021-09-02 | Disposition: A | Discharge status: Home / Self Care | Payer: Medicare Other | Attending: Emergency Medicine | Admitting: Emergency Medicine

## 2021-09-01 ENCOUNTER — Emergency Department (HOSPITAL_COMMUNITY): Payer: Medicare Other

## 2021-09-01 ENCOUNTER — Encounter (HOSPITAL_BASED_OUTPATIENT_CLINIC_OR_DEPARTMENT_OTHER): Payer: Self-pay | Admitting: Obstetrics and Gynecology

## 2021-09-01 DIAGNOSIS — I10 Essential (primary) hypertension: Secondary | ICD-10-CM | POA: Diagnosis not present

## 2021-09-01 DIAGNOSIS — G5132 Clonic hemifacial spasm, left: Secondary | ICD-10-CM | POA: Insufficient documentation

## 2021-09-01 DIAGNOSIS — R42 Dizziness and giddiness: Secondary | ICD-10-CM | POA: Diagnosis not present

## 2021-09-01 DIAGNOSIS — R0600 Dyspnea, unspecified: Secondary | ICD-10-CM

## 2021-09-01 DIAGNOSIS — R200 Anesthesia of skin: Secondary | ICD-10-CM | POA: Insufficient documentation

## 2021-09-01 DIAGNOSIS — Z79899 Other long term (current) drug therapy: Secondary | ICD-10-CM | POA: Diagnosis not present

## 2021-09-01 DIAGNOSIS — R0602 Shortness of breath: Secondary | ICD-10-CM | POA: Diagnosis not present

## 2021-09-01 DIAGNOSIS — R0789 Other chest pain: Secondary | ICD-10-CM | POA: Diagnosis not present

## 2021-09-01 DIAGNOSIS — R7309 Other abnormal glucose: Secondary | ICD-10-CM | POA: Insufficient documentation

## 2021-09-01 DIAGNOSIS — R110 Nausea: Secondary | ICD-10-CM | POA: Diagnosis not present

## 2021-09-01 LAB — URINALYSIS, ROUTINE W REFLEX MICROSCOPIC
Bilirubin Urine: NEGATIVE
Glucose, UA: NEGATIVE mg/dL
Hgb urine dipstick: NEGATIVE
Ketones, ur: 15 mg/dL — AB
Leukocytes,Ua: NEGATIVE
Nitrite: NEGATIVE
Protein, ur: NEGATIVE mg/dL
Specific Gravity, Urine: 1.026 (ref 1.005–1.030)
pH: 7.5 (ref 5.0–8.0)

## 2021-09-01 LAB — CBC WITH DIFFERENTIAL/PLATELET
Abs Immature Granulocytes: 0.01 10*3/uL (ref 0.00–0.07)
Basophils Absolute: 0 10*3/uL (ref 0.0–0.1)
Basophils Relative: 0 %
Eosinophils Absolute: 0 10*3/uL (ref 0.0–0.5)
Eosinophils Relative: 0 %
HCT: 34.9 % — ABNORMAL LOW (ref 36.0–46.0)
Hemoglobin: 11.6 g/dL — ABNORMAL LOW (ref 12.0–15.0)
Immature Granulocytes: 0 %
Lymphocytes Relative: 18 %
Lymphs Abs: 1.1 10*3/uL (ref 0.7–4.0)
MCH: 32.3 pg (ref 26.0–34.0)
MCHC: 33.2 g/dL (ref 30.0–36.0)
MCV: 97.2 fL (ref 80.0–100.0)
Monocytes Absolute: 0.2 10*3/uL (ref 0.1–1.0)
Monocytes Relative: 3 %
Neutro Abs: 4.5 10*3/uL (ref 1.7–7.7)
Neutrophils Relative %: 79 %
Platelets: 190 10*3/uL (ref 150–400)
RBC: 3.59 MIL/uL — ABNORMAL LOW (ref 3.87–5.11)
RDW: 12.5 % (ref 11.5–15.5)
WBC: 5.7 10*3/uL (ref 4.0–10.5)
nRBC: 0 % (ref 0.0–0.2)

## 2021-09-01 LAB — D-DIMER, QUANTITATIVE: D-Dimer, Quant: 0.31 ug/mL-FEU (ref 0.00–0.50)

## 2021-09-01 LAB — COMPREHENSIVE METABOLIC PANEL
ALT: 20 U/L (ref 0–44)
AST: 25 U/L (ref 15–41)
Albumin: 4.7 g/dL (ref 3.5–5.0)
Alkaline Phosphatase: 38 U/L (ref 38–126)
Anion gap: 11 (ref 5–15)
BUN: 17 mg/dL (ref 8–23)
CO2: 23 mmol/L (ref 22–32)
Calcium: 10.9 mg/dL — ABNORMAL HIGH (ref 8.9–10.3)
Chloride: 99 mmol/L (ref 98–111)
Creatinine, Ser: 1.04 mg/dL — ABNORMAL HIGH (ref 0.44–1.00)
GFR, Estimated: 54 mL/min — ABNORMAL LOW (ref >60)
Glucose, Bld: 133 mg/dL — ABNORMAL HIGH (ref 70–99)
Potassium: 3.9 mmol/L (ref 3.5–5.1)
Sodium: 133 mmol/L — ABNORMAL LOW (ref 135–145)
Total Bilirubin: 0.8 mg/dL (ref 0.3–1.2)
Total Protein: 7.6 g/dL (ref 6.5–8.1)

## 2021-09-01 LAB — TROPONIN I (HIGH SENSITIVITY)
Troponin I (High Sensitivity): 6 ng/L (ref <18)
Troponin I (High Sensitivity): 6 ng/L (ref <18)

## 2021-09-01 LAB — BRAIN NATRIURETIC PEPTIDE: B Natriuretic Peptide: 67.5 pg/mL (ref 0.0–100.0)

## 2021-09-01 MED ORDER — IOHEXOL 350 MG/ML SOLN
100.0000 mL | Freq: Once | INTRAVENOUS | Status: AC | PRN
Start: 2021-09-01 — End: 2021-09-01
  Administered 2021-09-01: 75 mL via INTRAVENOUS

## 2021-09-01 MED ORDER — ONDANSETRON HCL 4 MG/2ML IJ SOLN
4.0000 mg | Freq: Once | INTRAMUSCULAR | Status: AC
  Administered 2021-09-01: 4 mg via INTRAVENOUS
  Filled 2021-09-01: qty 2

## 2021-09-01 NOTE — ED Provider Notes (Addendum)
?[facility] EM[facility]PT ?Provider Note ? ? ?CSN: [id number] ?Arrival date & time: 09/01/21  1752 ? ?  ? ?History ? ?Chief Complaint  ?Patient presents with  ? Shortness of Breath  ? ? ?Mary Leblanc is a 81 y.o. female. ? ?HPI ? ?  ?81yo female with history of hypertension, hyperlipidemia, depression, loop recorder in place, syncope, IBS, dilated pancreatic duct, presented with concern for nausea, lightheadedness, shortness of breath beginning at 2:30 PM today, with left-sided facial spasm, numbness, and difficulty coordinating on the left noted while in the emergency department. ? ?Used to walk 4 miles a day,  haven't for a while, started walking again this past week and was doing fine until today ?Today at 230PM developed sudden dyspnea, lightheadedness and nausea ?Still feeling that way now ?Maybe a little chest pressure, left arm would periodically feel numb with it ?Lightheadedness and nausea worse when standing, dyspnea is worse with exertion, walking to bathroom ?No fever,k abdominal pain, cough, diarrhea, black or bloody stools. Has had IBS and had syncope. Seeing GI.  ?No medication changes  ?No cigarettes, etoh, other drugs ?No urinary symptoms ? ?Dad and his family have history of CAD, PE ? ?325mg   ? ?Denies numbness, weakness, difficulty talking, visual changes or facial droop on initial history .  Did report some difficulty walking due to lightheadedness, and feeling of arm tingling/numbness at the time of dyspnea and chest pressures. ? ?While in the ED< developed spasm of the left side of her face, describes the left side as feeling different than the right, "cold" feeling to left arm, left foot with numbness to the bottom of it.  ? ?Past Medical History:  ?Diagnosis Date  ? Anemia   ? Depression   ? Encounter for loop recorder check 02/14/2019  ? GERD (gastroesophageal reflux disease)   ? HTN (hypertension)   ? Hyperlipidemia   ? Osteopenia   ? Syncope and collapse 01/22/2019  ?  ?Home  Medications ?Prior to Admission medications   ?Medication Sig Start Date End Date Taking? Authorizing Provider  ?colestipol (COLESTID) 1 g tablet Take 1 tablet (1 g total) by mouth 2 (two) times daily. 07/13/21   Cirigliano, Vito V, DO  ?escitalopram (LEXAPRO) 20 MG tablet Take 20 mg by mouth daily. 05/05/21   [provider]  ?loperamide (IMODIUM) 2 MG capsule Take 4 mg by mouth at bedtime.    [provider]  ?omeprazole (PRILOSEC) 20 MG capsule Take 20 mg by mouth daily. 03/30/20   [provider]  ?Probiotic Product (PROBIOTIC PO) Take 1 capsule by mouth daily.    [provider]  ?rosuvastatin (CRESTOR) 10 MG tablet TAKE 1 TABLET BY MOUTH EVERY DAY 03/01/21   Patwardhan, Manish J, MD  ?valsartan (DIOVAN) 80 MG tablet Take 80 mg by mouth daily. 01/09/21   [provider]  ?   ? ?Allergies    ?Patient has no known allergies.   ? ?Review of Systems   ?Review of Systems ? ?Physical Exam ?Updated Vital Signs ?BP 124/78 (BP Location: Left Arm)   Pulse 61   Temp 98.2 ?F (36.8 ?C) (Oral)   Resp 17   Ht 5' (1.524 m)   Wt 48 kg   SpO2 100%   BMI 20.67 kg/m?  ?Physical Exam ?Vitals and nursing note reviewed.  ?Constitutional:   ?   General: She is not in acute distress. ?   Appearance: Normal appearance. She is well-developed. She is not ill-appearing or diaphoretic.  ?  Comments: Anxious appearance  ?HENT:  ?   Head: Normocephalic and atraumatic.  ?Eyes:  ?   General: No visual field deficit. ?   Extraocular Movements: Extraocular movements intact.  ?   Conjunctiva/sclera: Conjunctivae normal.  ?   Pupils: Pupils are equal, round, and reactive to light.  ?Cardiovascular:  ?   Rate and Rhythm: Normal rate and regular rhythm.  ?   Pulses: Normal pulses.  ?   Heart sounds: Normal heart sounds. No murmur heard. ?  No friction rub. No gallop.  ?Pulmonary:  ?   Effort: Pulmonary effort is normal. No respiratory distress.  ?   Breath sounds: Normal breath sounds. No wheezing or  rales.  ?Abdominal:  ?   General: There is no distension.  ?   Palpations: Abdomen is soft.  ?   Tenderness: There is no abdominal tenderness. There is no guarding.  ?Musculoskeletal:     ?   General: No swelling or tenderness.  ?   Cervical back: Normal range of motion.  ?Skin: ?   General: Skin is warm and dry.  ?   Findings: No erythema or rash.  ?Neurological:  ?   General: No focal deficit present.  ?   Mental Status: She is alert and oriented to person, place, and time.  ?   GCS: GCS eye subscore is 4. GCS verbal subscore is 5. GCS motor subscore is 6.  ?   Cranial Nerves: No cranial nerve deficit, dysarthria or facial asymmetry.  ?   Sensory: Sensory deficit (reports arm and leg feel different) present.  ?   Motor: No weakness or tremor.  ?   Coordination: Coordination abnormal (LUE). Finger-Nose-Finger Test normal.  ?   Gait: Gait normal.  ? ? ?ED Results / Procedures / Treatments   ?Labs ?(all labs ordered are listed, but only abnormal results are displayed) ?Labs Reviewed  ?URINALYSIS, ROUTINE W REFLEX MICROSCOPIC - Abnormal; Notable for the following components:  ?    Result Value  ? Ketones, ur 15 (*)   ? All other components within normal limits  ?CBC WITH DIFFERENTIAL/PLATELET - Abnormal; Notable for the following components:  ? RBC 3.59 (*)   ? Hemoglobin 11.6 (*)   ? HCT 34.9 (*)   ? All other components within normal limits  ?COMPREHENSIVE METABOLIC PANEL - Abnormal; Notable for the following components:  ? Sodium 133 (*)   ? Glucose, Bld 133 (*)   ? Creatinine, Ser 1.04 (*)   ? Calcium 10.9 (*)   ? GFR, Estimated 54 (*)   ? All other components within normal limits  ?BRAIN NATRIURETIC PEPTIDE  ?D-DIMER, QUANTITATIVE  ?TROPONIN I (HIGH SENSITIVITY)  ?TROPONIN I (HIGH SENSITIVITY)  ? ? ?EKG ?EKG Interpretation ? ?Date/Time:  Friday September 01 2021 18:06:00 EDT ?Ventricular Rate:  56 ?PR Interval:  145 ?QRS Duration: 102 ?QT Interval:  511 ?QTC Calculation: 494 ?R Axis:   44 ?Text Interpretation: Sinus  rhythm Borderline prolonged QT interval No significant change since last tracing Confirmed by Alvira Monday ([id number]) on 09/01/2021 7:49:09 PM ? ?Radiology ?CT ANGIO HEAD NECK W WO CM ? ?Result Date: 09/01/2021 ?CLINICAL DATA:  Neuro deficit, acute, stroke suspected EXAM: CT ANGIOGRAPHY HEAD AND NECK TECHNIQUE: Multidetector CT imaging of the head and neck was performed using the standard protocol during bolus administration of intravenous contrast. Multiplanar CT image reconstructions and MIPs were obtained to evaluate the vascular anatomy. Carotid stenosis measurements (when applicable) are obtained utilizing NASCET criteria, using the  distal internal carotid diameter as the denominator. RADIATION DOSE REDUCTION: This exam was performed according to the departmental dose-optimization program which includes automated exposure control, adjustment of the mA and/or kV according to patient size and/or use of iterative reconstruction technique. CONTRAST:  75mL OMNIPAQUE IOHEXOL 350 MG/ML SOLN COMPARISON:  None. FINDINGS: CT HEAD Brain: There is no acute intracranial hemorrhage, mass effect, or edema. Gray-white differentiation is preserved. There is no extra-axial fluid collection. Ventricles and sulci are within normal limits in size and configuration. Patchy and confluent hypoattenuation in the supratentorial white matter is nonspecific but may reflect mild to moderate chronic microvascular ischemic changes. Vascular: No hyperdense vessel. Skull: Calvarium is unremarkable. Sinuses/Orbits: No acute finding. Other: None. Review of the MIP images confirms the above findings CTA NECK Suboptimal contrast bolus timing with greater venous and arterial enhancement. Aortic arch: Unremarkable.  Great vessel origins are patent. Right carotid system: Patent. Mild eccentric noncalcified plaque causing minimal stenosis. Left carotid system: Patent. Trace noncalcified plaque at the ICA origin without stenosis. Vertebral arteries:  Patent and codominant. Left vertebral arises directly from the arch. Stenosis. Skeleton: Advanced degenerative changes of the cervical spine. Prior posterior decompression from C2-C3 to C6-C7. Other neck: No sign

## 2021-09-01 NOTE — ED Triage Notes (Signed)
Patient reports to the ER for dizziness, nausea, and shortness of breath since around 1430 this afternoon. Endorses trying to do laps yesterday around the walking track and becoming quickly fatigued and short of breath ?

## 2021-09-01 NOTE — ED Notes (Signed)
ED Provider at bedside. 

## 2021-09-02 ENCOUNTER — Other Ambulatory Visit: Payer: Self-pay | Admitting: Cardiology

## 2021-09-02 ENCOUNTER — Emergency Department (HOSPITAL_COMMUNITY): Payer: Medicare Other

## 2021-09-02 DIAGNOSIS — R42 Dizziness and giddiness: Secondary | ICD-10-CM | POA: Diagnosis not present

## 2021-09-02 DIAGNOSIS — E782 Mixed hyperlipidemia: Secondary | ICD-10-CM

## 2021-09-02 MED ORDER — LORAZEPAM 1 MG PO TABS
1.0000 mg | ORAL_TABLET | Freq: Once | ORAL | Status: AC
  Administered 2021-09-02: 1 mg via ORAL
  Filled 2021-09-02: qty 1

## 2021-09-02 NOTE — Consult Note (Signed)
NEUROLOGY CONSULTATION NOTE  ? ?Date of service: September 02, 2021 ?Patient Name: Mary Leblanc ?MRN:  [id number] ?DOB:  1940-11-20 ?Reason for consult: "episode of SOB, nausea, lightheaded and off balance, L facial spams and L hand cold" ?Requesting Provider: Geoffery Lyons, MD ?_ _ _   _ __   _ __ _ _  __ __   _ __   __ _ ? ?History of Present Illness  ?Mary Leblanc is a 81 y.o. female with PMH significant for anemia, GERD, hypertension, hyperlipidemia, osteopenia, IBS and syncope. ? ?She reports that she recently got back into walking and was going to walk about a mile but had to cut her walk short because of shortness of breath.  She felt this happened because she did not eat well in the morning.  She ate a little and felt nauseous.  She lie down and felt a little lightheaded.  When she got up, she felt wobbly. She reports that shortness of breath was worse with walking to the bathroom.  She denies any spinning, the ground was not shaking. She was very anxious. Checked her blood pressure and was elevated. ? ?She also reports that at somepoint, her L face appeared more drawn in and twitching and spams of left cheek. She was touching her hands and felt that her L hand was colder than the Right hand but no numbness. ? ?No fever, reports dry cough that ended last week. No urinary symptoms. She used to walk about 4 miles a day but stopped due to syncope and IBS.  ? ?Symptoms have now completely resolved and she is back to her baseline. ? ?Reports family hx of strokes, no prior personally hx of strokes. BP is well controlled on Vaalsartan, takes rosuvastatin for hyperlipidemia and is well controlled.  She does not smoke, no marijuana, no cigars, does not drink alcohol. ?  ?ROS  ? ?Constitutional Denies weight loss, fever and chills.   ?HEENT Denies changes in vision and hearing.   ?Respiratory Denies SOB and cough at this time.   ?CV Denies palpitations and CP   ?GI Denies abdominal pain, nausea, vomiting and diarrhea.   ?GU  Denies dysuria and urinary frequency.   ?MSK Denies myalgia and joint pain. Endorses mujscle cramps.  ?Skin Denies rash and pruritus.   ?Neurological Denies headache and syncope.   ?Psychiatric Denies recent changes in mood. Denies anxiety and depression.   ? ?Past History  ? ?Past Medical History:  ?Diagnosis Date  ? Anemia   ? Depression   ? Encounter for loop recorder check 02/14/2019  ? GERD (gastroesophageal reflux disease)   ? HTN (hypertension)   ? Hyperlipidemia   ? Osteopenia   ? Syncope and collapse 01/22/2019  ? ?Past Surgical History:  ?Procedure Laterality Date  ? ANAL RECTAL MANOMETRY N/A 08/15/2020  ? Procedure: ANO RECTAL MANOMETRY;  Surgeon: Shellia Cleverly, DO;  Location: [facility] ENDOSCOPY;  Service: Gastroenterology;  Laterality: N/A;  ? APPENDECTOMY  2000  ? BIOPSY  09/15/2020  ? Procedure: BIOPSY;  Surgeon: Lemar Lofty., MD;  Location: [facility] ENDOSCOPY;  Service: Gastroenterology;;  ? CERVICAL LAMINOPLASTY  2002  ? CHOLECYSTECTOMY  2013  ? ESOPHAGOGASTRODUODENOSCOPY (EGD) WITH PROPOFOL N/A 09/15/2020  ? Procedure: ESOPHAGOGASTRODUODENOSCOPY (EGD) WITH PROPOFOL;  Surgeon: Meridee Score Netty Starring., MD;  Location: [facility] ENDOSCOPY;  Service: Gastroenterology;  Laterality: N/A;  ? EUS N/A 09/15/2020  ? Procedure: UPPER ENDOSCOPIC ULTRASOUND (EUS) RADIAL;  Surgeon: Meridee Score Netty Starring., MD;  Location: [facility] ENDOSCOPY;  Service: Gastroenterology;  Laterality: N/A;  ? ?Family History  ?Problem Relation Age of Onset  ? Hypertension Mother   ? Stroke Mother   ? Thyroid cancer Mother   ? Diabetes Mother   ? Heart attack Father   ? Prostate cancer Father   ? Kidney cancer Father   ? Clotting disorder Father   ? Diabetes Father   ? Non-Hodgkin's lymphoma Sister   ? Diabetes Brother   ? Heart disease Maternal Grandmother   ? Liver cancer Maternal Grandfather   ?     not sure if it was liver or pancreatic cancer  ? Heart disease Paternal Grandmother   ? Heart Problems Paternal Grandfather 52  ? Colon cancer Neg Hx    ? Esophageal cancer Neg Hx   ? Pancreatic cancer Neg Hx   ? ?Social History  ? ?Socioeconomic History  ? Marital status: Married  ?  Spouse name: Not on file  ? Number of children: 2  ? Years of education: Not on file  ? Highest education level: Not on file  ?Occupational History  ? Occupation: retired Char[employer] fundraiser  ?Tobacco Use  ? Smoking status: Former  ?  Packs/day: 1.00  ?  Years: 10.00  ?  Pack years: 10.00  ?  Types: Cigarettes  ?  Quit date: 1968  ?  Years since quitting: 55.3  ? Smokeless tobacco: Never  ?Vaping Use  ? Vaping Use: Never used  ?Substance and Sexual Activity  ? Alcohol use: Yes  ?  Comment: occ-.5 per day. stopped drinking 3/17  ? Drug use: Not Currently  ? Sexual activity: Yes  ?Other Topics Concern  ? Not on file  ?Social History Narrative  ? Not on file  ? ?Social Determinants of Health  ? ?Financial Resource Strain: Not on file  ?Food Insecurity: Not on file  ?Transportation Needs: Not on file  ?Physical Activity: Not on file  ?Stress: Not on file  ?Social Connections: Not on file  ? ?No Known Allergies ? ?Medications  ?(Not in a hospital admission) ?  ? ?Vitals  ? ?Vitals:  ? 09/01/21 1930 09/01/21 2134 09/01/21 2318 09/02/21 0517  ?BP: (!) 171/96 (!) 159/71 (!) 141/73 127/71  ?Pulse: (!) 57 (!) 56 (!) 55 (!) 50  ?Resp: 16 17 16 18   ?Temp:    98 ?F (36.7 ?C)  ?TempSrc:    Oral  ?SpO2: 100% 100% 99% 99%  ?Weight:      ?Height:      ?  ? ?Body mass index is 20.67 kg/m?. ? ?Physical Exam  ? ?General: Laying comfortably in bed; in no acute distress.  ?HENT: Normal oropharynx and mucosa. Normal external appearance of ears and nose.  ?Neck: Supple, no pain or tenderness  ?CV: No JVD. No peripheral edema.  ?Pulmonary: Symmetric Chest rise. Normal respiratory effort.  ?Abdomen: Soft to touch, non-tender.  ?Ext: No cyanosis, edema, or deformity  ?Skin: No rash. Normal palpation of skin.   ?Musculoskeletal: Normal digits and nails by inspection. No clubbing.  ? ?Neurologic Examination  ?Mental  status/Cognition: Alert, oriented to self, place, month and year, good attention.  ?Speech/language: Fluent, comprehension intact, object naming intact, repetition intact.  ?Cranial nerves:  ? CN II Pupils equal and reactive to light, no VF deficits   ? CN III,IV,VI EOM intact, no gaze preference or deviation, no nystagmus   ? CN V normal sensation in V1, V2, and V3 segments bilaterally   ? CN VII no asymmetry, no nasolabial  fold flattening   ? CN VIII normal hearing to speech   ? CN IX & X normal palatal elevation, no uvular deviation   ? CN XI 5/5 head turn and 5/5 shoulder shrug bilaterally   ? CN XII midline tongue protrusion   ? ?Motor:  ?Muscle bulk: normal, tone normal, pronator drift none tremor none ?Mvmt Root Nerve  Muscle Right Left Comments  ?SA C5/6 Ax Deltoid 5 5   ?EF C5/6 Mc Biceps 5 5   ?EE C6/7/8 Rad Triceps 5 5   ?WF C6/7 Med FCR     ?WE C7/8 PIN ECU     ?F Ab C8/T1 U ADM/FDI 5 5   ?HF L1/2/3 Fem Illopsoas 5 5   ?KE L2/3/4 Fem Quad 5 5   ?DF L4/5 D Peron Tib Ant 5 5   ?PF S1/2 Tibial Grc/Sol 5 5   ? ?Reflexes: ? Right Left Comments  ?Pectoralis     ? Biceps (C5/6) 2+ 2+   ?Brachioradialis (C5/6) 2+ 2+   ? Triceps (C6/7) 2+ 2+   ? Patellar (L3/4) 3 3   ? Achilles (S1) 3 3   ? Hoffman     ? Plantar     ?Jaw jerk   ? ?Sensation: ? Light touch   ? Pin prick   ? Temperature   ? Vibration   ?Proprioception   ? ?Coordination/Complex Motor:  ?- Finger to Nose intact BL ?- Heel to shin intact BL ?- Rapid alternating movement are normal ?- Gait: Deferred. ? ?Labs  ? ?CBC:  ?Recent Labs  ?Lab 09/01/21 ?1820  ?WBC 5.7  ?NEUTROABS 4.5  ?HGB 11.6*  ?HCT 34.9*  ?MCV 97.2  ?PLT 190  ? ? ?Basic Metabolic Panel:  ?Lab Results  ?Component Value Date  ? NA 133 (L) 09/01/2021  ? K 3.9 09/01/2021  ? CO2 23 09/01/2021  ? GLUCOSE 133 (H) 09/01/2021  ? BUN 17 09/01/2021  ? CREATININE 1.04 (H) 09/01/2021  ? CALCIUM 10.9 (H) 09/01/2021  ? GFRNONAA 54 (L) 09/01/2021  ? GFRAA 58.0 12/16/2020  ? ?Lipid Panel: No results found  for: LDLCALC ?HgbA1c: No results found for: HGBA1C ?Urine Drug Screen: No results found for: LABOPIA, COCAINSCRNUR, LABBENZ, AMPHETMU, THCU, LABBARB  ?Alcohol Level  ?   ?Component Value Date/Time  ? ETH <10 02

## 2021-09-02 NOTE — ED Notes (Signed)
The pt and her husband are asking for info about her mri ?

## 2021-09-02 NOTE — ED Notes (Signed)
Patient transported to MRI 

## 2021-09-02 NOTE — Discharge Instructions (Signed)
Lab work imaging were all reassuring, please continue with all home medications. ? ?Like you to follow-up with neurology given the contact info above please call to schedule a follow-up appointment. ? ?Come back to the emergency department if you develop chest pain, shortness of breath, severe abdominal pain, uncontrolled nausea, vomiting, diarrhea. ? ?

## 2021-09-02 NOTE — ED Provider Notes (Addendum)
Patient with medical history including hypertension, hyperlipidemia, depression, IBS, presents with complaints of lightheaded dizziness short of breath started around 2 PM today.  States that she was out walking and all of a sudden felt dizziness as if the room was spinning and felt very off balance denies any headaches, change in vision, paresthesias or weakness of her lower extremities.  She denies any recent head trauma, not on anticoag's.  States that she is never had this in the past.  She denies any chest pain or pleuritic chest pain, she has no cardiac history, no history PEs or DVTs currently on hormone therapy. ?  ?Patient was sent from [facility] for [facility], while in the emergency room patient had left-sided facial spasm numbness and and noted to have left-sided heaviness and felt cold. ?Physical Exam  ?BP (!) 141/73 (BP Location: Right Arm)   Pulse (!) 55   Temp 98 ?F (36.7 ?C) (Oral)   Resp 16   Ht 5' (1.524 m)   Wt 48 kg   SpO2 99%   BMI 20.67 kg/m?  ? ?Physical Exam ?Vitals and nursing note reviewed.  ?Constitutional:   ?   General: She is not in acute distress. ?   Appearance: She is not ill-appearing.  ?HENT:  ?   Head: Normocephalic and atraumatic.  ?   Comments: No deformity of the head present, no raccoon eyes about sign noted. ?   Nose: No congestion.  ?   Mouth/Throat:  ?   Comments: No oral trauma present no trismus or torticollis ?Eyes:  ?   Extraocular Movements: Extraocular movements intact.  ?   Conjunctiva/sclera: Conjunctivae normal.  ?   Pupils: Pupils are equal, round, and reactive to light.  ?Cardiovascular:  ?   Rate and Rhythm: Normal rate and regular rhythm.  ?   Pulses: Normal pulses.  ?   Heart sounds: No murmur heard. ?  No friction rub. No gallop.  ?Pulmonary:  ?   Effort: No respiratory distress.  ?   Breath sounds: No wheezing, rhonchi or rales.  ?Skin: ?   General: Skin is warm and dry.  ?Neurological:  ?   Mental Status: She is alert.  ?   Cranial Nerves: Cranial nerves  2-12 are intact. No cranial nerve deficit.  ?   Motor: Motor function is intact. No weakness.  ?   Coordination: Romberg sign negative. Finger-Nose-Finger Test and Heel to [place] Test normal.  ?   Gait: Gait abnormal.  ?   Comments: Cranial nerves II through XII grossly intact, no difficulty with word finding, follow two-step commands, no unilateral weakness present.  Alert and orient x4, able to identify common objects like a pen and a cell phone.  Had a slightly off balance gait.  But able to ambulate without assistance  ?Psychiatric:     ?   Mood and Affect: Mood normal.  ? ? ?Procedures  ?Procedures ? ?ED Course / MDM  ?  ?Medical Decision Making ?Amount and/or Complexity of Data Reviewed ?Labs: ordered. ?Radiology: ordered. ? ?Risk ?Prescription drug management. ? ? ?Lab work-which I have personally evaluated and r interpreted CBC shows normocytic anemia hemoglobin 11.6, CMP shows sodium 133 glucose 133 creatinine 1.04 calcium 10.9 GFR 54, UA unremarkable BNP 67, D-dimer negative, negative delta troponin ? ?Imaging-which I have personally reviewed and interpreted chest x-ray unremarkable, CTA head and neck negative acute findings, MRI brain unremarkable ? ?EKG-which I have personally reviewed and interpreted without signs of ischemia ? ? ?Reassessment- ? ?  Patient is reassessed she is resting comfortably, she is having no complaints, I am concerned for possible TIA she endorsed some left-sided facial weakness and abnormal sensation on the left side of her body, will consult with neurology for further recommendations. ? ?Updated the patient recommendation from neurology she is given this plan is ready for discharge. ? ?Consultation-spoke with neurologist Dr.Khaliqdina who has evaluated the patient feels TIA is less likely at this time and recommends outpatient follow-up with neurology. ? ?Rule out ? ?low suspicion for internal head bleed and or mass as CT imaging is negative for acute findings.  Low suspicion for  CVA she has no focal deficit present my exam MRI negative for acute findings.  Low suspicion for dissection of the vertebral or carotid artery as presentation atypical of etiology CTA head and neck negative for acute findings.  Low suspicion for meningitis as she has no meningeal sign present.  Low suspicion for ACS negative delta troponins negative EKG presentation atypical of etiology low suspicion for PE as D-dimer is negative ? ? ?Disposition ? ?Dizziness-unclear etiology by suspect this is likely secondary due to vertigo, will recommend she follows up with neurology for further evaluation and strict return precautions. ? ? ? ? ? ? ? ? ?  ?Carroll Sage, PA-C ?09/02/21 0708 ? ?  ?Carroll Sage, PA-C ?09/02/21 2458 ? ?  ?Geoffery Lyons, MD ?09/03/21 [phone] ? ?

## 2021-09-05 ENCOUNTER — Encounter: Payer: Self-pay | Admitting: Neurology

## 2021-09-17 ENCOUNTER — Telehealth: Payer: Self-pay | Admitting: Cardiology

## 2021-09-24 NOTE — Telephone Encounter (Signed)
Loop EOL

## 2021-09-26 ENCOUNTER — Encounter: Payer: Self-pay | Admitting: Neurology

## 2021-09-26 ENCOUNTER — Ambulatory Visit (INDEPENDENT_AMBULATORY_CARE_PROVIDER_SITE_OTHER): Payer: Medicare Other | Admitting: Neurology

## 2021-09-26 VITALS — BP 124/73 | HR 60 | Ht 60.0 in | Wt 110.2 lb

## 2021-09-26 DIAGNOSIS — R55 Syncope and collapse: Secondary | ICD-10-CM | POA: Diagnosis not present

## 2021-09-26 DIAGNOSIS — R531 Weakness: Secondary | ICD-10-CM | POA: Diagnosis not present

## 2021-09-26 DIAGNOSIS — R253 Fasciculation: Secondary | ICD-10-CM | POA: Diagnosis not present

## 2021-09-26 NOTE — Progress Notes (Signed)
NEUROLOGY CONSULTATION NOTE  Mary Leblanc MRN: [id number] DOB: [birth year]  Referring provider: Dr. Veryl Speak (ER) Primary care provider: Dr. Domenick Gong  Reason for consult:  dizziness  Dear Dr Stark Jock:  Thank you for your kind referral of Mary Leblanc for consultation of the above symptoms. Although her history is well known to you, please allow me to reiterate it for the purpose of our medical record. The patient was accompanied to the clinic by her husband who also provides collateral information. Records and images were personally reviewed where available.   HISTORY OF PRESENT ILLNESS: This is a very pleasant 81 year old right-handed woman with a history of hypertension, hyperlipidemia, syncope, IBS-D, presenting after ER visit on 09/01/2021 for dizziness and left-sided symptoms. Notes were reviewed. She used to walk 4 miles a day and had stopped for a time then recently got back into walking when that day she started feeling short of breath and even more fatigued. She went home and felt lightheaded, she lay down and took her BP, noting SBP 170, and it stayed between 160-165 for a number of hours. She called her PCP and was instructed to go to the ER. She denies any spinning sensation, no speech changes, or focal symptoms initially. While she was in the ER, she started having left facial twitching, her husband also reports her left leg was twitching. Her left side felt weak. Per notes, she had left-sided facial spasm, numbness, heaviness, and difficulty coordinating on the left. Her left hand felt colder than the right. She was evaluated by Neurology with normal exam. BP initially was 171/96. EKG showed NSR. She had a brain MRI without contrast which I personally reviewed, no acute changes, there was mild chronic microvascular disease. CTA head and neck did not show any flow-limiting stenosis. No further similar episodes in the past month.   They report an episode of decreased  responsiveness last 4/19, her husband shows a picture of her lying on the floor with eyes open, laundry around her. It took her a few minutes to come out of it. Her loop recorder did not show any abnormalities. She states that there was no warning that she could recall with this episode, in the past she has had intermittent syncope since her late teens where she could feel it happening and would feel lightheaded and get herself to the bed. Her husband denies any other staring/unresponsive episodes. She denies any other loss of time, no olfactory/gustatory hallucinations, myoclonic jerks. They report this episode occurred before she started the colestipol which has helped a lot with her diarrhea. She used to have migraines (strong family history) but recently has had occasional twinges of pain on the left frontal region with good response to acetaminophen. She occasionally notices difficulty swallowing. Her left hand still feels weaker, she occasionally notices an electric sensation in her left arm. She reports an average of 7-8 hours of interrupted sleep, she wakes up 3-4 times at night. She drinks an occasional glass of wine. Her mother has a history of stroke. Her sister had petit mal seizures in childhood that she outgrew. She denies any history of febrile seizures, significant head injuries, CNS infections.    PAST MEDICAL HISTORY: Past Medical History:  Diagnosis Date   Anemia    Depression    Encounter for loop recorder check 02/14/2019   GERD (gastroesophageal reflux disease)    HTN (hypertension)    Hyperlipidemia    Osteopenia    Syncope and collapse  01/22/2019    PAST SURGICAL HISTORY: Past Surgical History:  Procedure Laterality Date   ANAL RECTAL MANOMETRY N/A 08/15/2020   Procedure: ANO RECTAL MANOMETRY;  Surgeon: Lavena Bullion, DO;  Location: [facility];  Service: Gastroenterology;  Laterality: N/A;   APPENDECTOMY  2000   BIOPSY  09/15/2020   Procedure: BIOPSY;  Surgeon:  Irving Copas., MD;  Location: [facility];  Service: Gastroenterology;;   CERVICAL LAMINOPLASTY  2002   CHOLECYSTECTOMY  2013   ESOPHAGOGASTRODUODENOSCOPY (EGD) WITH PROPOFOL N/A 09/15/2020   Procedure: ESOPHAGOGASTRODUODENOSCOPY (EGD) WITH PROPOFOL;  Surgeon: Irving Copas., MD;  Location: [facility];  Service: Gastroenterology;  Laterality: N/A;   EUS N/A 09/15/2020   Procedure: UPPER ENDOSCOPIC ULTRASOUND (EUS) RADIAL;  Surgeon: Irving Copas., MD;  Location: [facility];  Service: Gastroenterology;  Laterality: N/A;    MEDICATIONS: Current Outpatient Medications on File Prior to Visit  Medication Sig Dispense Refill   colestipol (COLESTID) 1 g tablet Take 1 tablet (1 g total) by mouth 2 (two) times daily. 60 tablet 2   escitalopram (LEXAPRO) 20 MG tablet Take 20 mg by mouth daily.     loperamide (IMODIUM) 2 MG capsule Take 4 mg by mouth at bedtime.     omeprazole (PRILOSEC) 20 MG capsule Take 20 mg by mouth daily.     Probiotic Product (PROBIOTIC PO) Take 1 capsule by mouth daily.     rosuvastatin (CRESTOR) 10 MG tablet TAKE 1 TABLET BY MOUTH EVERY DAY 90 tablet 1   valsartan (DIOVAN) 80 MG tablet Take 80 mg by mouth daily.     No current facility-administered medications on file prior to visit.    ALLERGIES: No Known Allergies  FAMILY HISTORY: Family History  Problem Relation Age of Onset   Hypertension Mother    Stroke Mother    Thyroid cancer Mother    Diabetes Mother    Heart attack Father    Prostate cancer Father    Kidney cancer Father    Clotting disorder Father    Diabetes Father    Non-Hodgkin's lymphoma Sister    Diabetes Brother    Heart disease Maternal Grandmother    Liver cancer Maternal Grandfather        not sure if it was liver or pancreatic cancer   Heart disease Paternal Grandmother    Heart Problems Paternal Grandfather 39   Colon cancer Neg Hx    Esophageal cancer Neg Hx    Pancreatic cancer Neg Hx     SOCIAL  HISTORY: Social History   Socioeconomic History   Marital status: Married    Spouse name: Not on file   Number of children: 2   Years of education: Not on file   Highest education level: Not on file  Occupational History   Occupation: retired Therapist, sports  Tobacco Use   Smoking status: Former    Packs/day: 1.00    Years: 10.00    Pack years: 10.00    Types: Cigarettes    Quit date: 1968    Years since quitting: 55.4   Smokeless tobacco: Never  Vaping Use   Vaping Use: Never used  Substance and Sexual Activity   Alcohol use: Yes    Comment: occ-.5 per day. stopped drinking 3/17   Drug use: Not Currently   Sexual activity: Yes  Other Topics Concern   Not on file  Social History Narrative   Right handed   Lives with husband   Social Determinants of Health   Financial  Resource Strain: Not on file  Food Insecurity: Not on file  Transportation Needs: Not on file  Physical Activity: Not on file  Stress: Not on file  Social Connections: Not on file  Intimate Partner Violence: Not on file     PHYSICAL EXAM: Vitals:   09/26/21 0849  BP: 124/73  Pulse: 60  SpO2: 100%   General: No acute distress Head:  Normocephalic/atraumatic Skin/Extremities: No rash, no edema Neurological Exam: Mental status: alert and awake, no dysarthria or aphasia, Fund of knowledge is appropriate.  Attention and concentration are normal.   Cranial nerves: CN I: not tested CN II: pupils equal, round and reactive to light, visual fields intact CN III, IV, VI:  full range of motion, no nystagmus, no ptosis CN V: facial sensation intact CN VII: upper and lower face symmetric CN VIII: hearing intact to conversation CN IX, X: gag intact, uvula midline CN XI: sternocleidomastoid and trapezius muscles intact CN XII: tongue midline Bulk & Tone: normal, no fasciculations. Motor: 5/5 throughout with no pronator drift. Sensation: intact to light touch, cold, pin, vibration sense.  No extinction to double  simultaneous stimulation.  Romberg test negative Deep Tendon Reflexes: brisk +2 throughout, +right Hoffman, no ankle clonus Plantar responses: downgoing bilaterally Cerebellar: no incoordination on finger to nose testing Gait: narrow-based and steady, able to tandem walk adequately. Tremor: none   IMPRESSION: This is a very pleasant 81 year old right-handed woman with a history of hypertension, hyperlipidemia, syncope, IBS-D, presenting after ER visit on 09/01/2021 for dizziness and left-sided symptoms. Etiology unclear. She had shortness of breath prior to the episode, which would be unusual for TIA. Her neurological exam today is normal. She has a loop recorder which has not shown any abnormalities. She reports left facial and leg twitching last 09/01/21. Her husband shows a picture of an episode of loss of awareness last 08/23/21 where she fell on the floor with eyes open, briefly unresponsive, loop recorder no changes. MRI brain and CTA head and neck normal. With these symptoms, would do a 1-hour EEG. If EEG is normal, would recommend starting aspirin 81mg  daily in addition to controlling vascular risk factors. Follow-up with Dr. Virgina Jock. We discussed  driving laws, continue to monitor symptoms. Follow-up in 6 months, call for any changes.    Thank you for allowing me to participate in the care of this patient. Please do not hesitate to call for any questions or concerns.   Ellouise Newer, M.D.  CC: Dr. Stark Jock, Dr. Osborne Casco, Dr. Virgina Jock

## 2021-09-26 NOTE — Patient Instructions (Signed)
Good to meet you.  Schedule 1-hour EEG  Our office will call with EEG results. If normal, let's plan to start a daily baby aspirin.   3. Follow-up in 6 months, call for any changes

## 2021-09-27 ENCOUNTER — Ambulatory Visit (INDEPENDENT_AMBULATORY_CARE_PROVIDER_SITE_OTHER): Payer: Medicare Other | Admitting: Neurology

## 2021-09-27 DIAGNOSIS — R531 Weakness: Secondary | ICD-10-CM | POA: Diagnosis not present

## 2021-09-27 DIAGNOSIS — R55 Syncope and collapse: Secondary | ICD-10-CM | POA: Diagnosis not present

## 2021-09-27 DIAGNOSIS — R253 Fasciculation: Secondary | ICD-10-CM | POA: Diagnosis not present

## 2021-09-29 ENCOUNTER — Other Ambulatory Visit: Payer: Self-pay | Admitting: Obstetrics and Gynecology

## 2021-09-29 DIAGNOSIS — N6310 Unspecified lump in the right breast, unspecified quadrant: Secondary | ICD-10-CM

## 2021-10-03 ENCOUNTER — Ambulatory Visit
Admission: RE | Admit: 2021-10-03 | Discharge: 2021-10-03 | Disposition: A | Discharge status: Home / Self Care | Payer: Medicare Other | Source: Ambulatory Visit | Attending: Obstetrics and Gynecology | Admitting: Obstetrics and Gynecology

## 2021-10-03 ENCOUNTER — Encounter: Payer: Self-pay | Admitting: Neurology

## 2021-10-03 DIAGNOSIS — N6310 Unspecified lump in the right breast, unspecified quadrant: Secondary | ICD-10-CM

## 2021-10-03 NOTE — Procedures (Signed)
ELECTROENCEPHALOGRAM REPORT  Date of Study: 09/27/2021  Patient's Name: Mary Leblanc MRN: [id number] Date of Birth: 1940/06/23  Referring Provider: Dr. Patrcia Dolly  Clinical History: This is an 81 year old woman with recurrent syncope, an episode of dizziness with left facial and leg twitching. EEG for classification.  Medications: COLESTID 1 g tablet LEXAPRO 20 MG tablet IMODIUM 2 MG capsule PRILOSEC 20 MG capsule PROBIOTIC PO CRESTOR 10 MG tablet DIOVAN 80 MG tablet  Technical Summary: A multichannel digital 1-hour EEG recording measured by the international 10-20 system with electrodes applied with paste and impedances below 5000 ohms performed in our laboratory with EKG monitoring in an awake and asleep patient.  Hyperventilation was not performed. Photic stimulation was performed.  The digital EEG was referentially recorded, reformatted, and digitally filtered in a variety of bipolar and referential montages for optimal display.    Description: The patient is awake and asleep during the recording.  During maximal wakefulness, there is a symmetric, medium voltage 9.5 Hz posterior dominant rhythm that attenuates with eye opening.  The record is symmetric.  During drowsiness and sleep, there is an increase in theta slowing of the background, with shifting asymmetry over the bilateral temporal regions.  Vertex waves and symmetric sleep spindles were seen. Photic stimulation did not elicit any abnormalities.  There were no epileptiform discharges or electrographic seizures seen.    EKG lead was unremarkable.  Impression: This 1-hour awake and asleep EEG is within normal limits for age.  Clinical Correlation: A normal EEG does not exclude a clinical diagnosis of epilepsy.  If further clinical questions remain, prolonged EEG may be helpful.  Clinical correlation is advised.   Patrcia Dolly, M.D.

## 2021-10-10 ENCOUNTER — Other Ambulatory Visit: Payer: Self-pay | Admitting: Gastroenterology

## 2021-10-10 DIAGNOSIS — K529 Noninfective gastroenteritis and colitis, unspecified: Secondary | ICD-10-CM

## 2021-10-31 ENCOUNTER — Encounter: Payer: Self-pay | Admitting: Cardiology

## 2021-11-10 NOTE — Telephone Encounter (Signed)
Appt has been scheduled.

## 2021-11-14 ENCOUNTER — Ambulatory Visit: Payer: Medicare Other | Admitting: Cardiology

## 2021-11-14 ENCOUNTER — Encounter: Payer: Self-pay | Admitting: Cardiology

## 2021-11-14 VITALS — BP 122/72 | HR 74 | Temp 98.0°F | Resp 16 | Ht 60.0 in | Wt 112.0 lb

## 2021-11-14 DIAGNOSIS — Z9889 Other specified postprocedural states: Secondary | ICD-10-CM

## 2021-11-14 DIAGNOSIS — R55 Syncope and collapse: Secondary | ICD-10-CM

## 2021-11-14 DIAGNOSIS — Z4509 Encounter for adjustment and management of other cardiac device: Secondary | ICD-10-CM

## 2021-11-14 NOTE — Progress Notes (Signed)
    Patient referred by Gaspar Garbe, MD for syncope  Subjective:   Mary Leblanc, female    DOB: 04/01/1941, 81 y.o.   MRN: [id number]   Chief Complaint  Patient presents with   loop recorder    explantation   Mary Leblanc is a 81 y.o.  Caucasian female with hypertension, syncope, had loop recorder implanted remotely for evaluation of syncope and now the loop recorder has reached end-of-life.  Patient preferred to have loop explanted and now presents for the same.  Otherwise remains asymptomatic.    Current Outpatient Medications:    colestipol (COLESTID) 1 g tablet, TAKE 1 TABLET BY MOUTH 2 TIMES DAILY., Disp: 180 tablet, Rfl: 0   escitalopram (LEXAPRO) 20 MG tablet, Take 20 mg by mouth daily., Disp: , Rfl:    loperamide (IMODIUM) 2 MG capsule, Take 4 mg by mouth at bedtime., Disp: , Rfl:    omeprazole (PRILOSEC) 20 MG capsule, Take 20 mg by mouth daily., Disp: , Rfl:    Probiotic Product (PROBIOTIC PO), Take 1 capsule by mouth daily., Disp: , Rfl:    rosuvastatin (CRESTOR) 10 MG tablet, TAKE 1 TABLET BY MOUTH EVERY DAY, Disp: 90 tablet, Rfl: 1   valsartan (DIOVAN) 80 MG tablet, Take 80 mg by mouth daily., Disp: , Rfl:    Review of Systems  Cardiovascular:  Negative for chest pain, dyspnea on exertion and leg swelling.   Vitals:   11/14/21 0904  BP: 122/72  Pulse: 74  Resp: 16  Temp: 98 F (36.7 C)  SpO2: 98%   Objective:   Physical Exam Vitals reviewed.  Neck:     Vascular: No JVD.  Cardiovascular:     Rate and Rhythm: Normal rate and regular rhythm.     Heart sounds: Normal heart sounds. No murmur heard. Pulmonary:     Effort: Pulmonary effort is normal.     Breath sounds: Normal breath sounds. No wheezing or rales.  Abdominal:     General: Bowel sounds are normal.     Palpations: Abdomen is soft.     Assessment & Recommendations:     ICD-10-CM   1. Encounter for loop recorder check  Z45.09     2. Loop recorder Medtronic Linq  Z98.890     3.  Syncope and collapse  R55      Remote loop recorder transmission 08/28/2021: Predominant rhythm is normal sinus rhythm. There were no patient activated events. Occasional PACs.  09/15/2021: Battery EOL.   Patient presents for loop recorder explantation, procedure has been explained to the patient and she is willing to proceed.  Risks including but not limited to bleeding, infection discussed.  Procedural data: Under sterile precautions, using 1% lidocaine, the loop area was infiltrated with lidocaine.  A small half a centimeter incision was made at the previous entry site of the loop and after dissecting the tissue, loop recorder was retrieved without any complications.  Wound was closed with Dermabond and Steri-Strips.  Patient tolerated the procedure well.  No immediate complications.    Yates Decamp, MD, [facility] 11/14/2021, 9:11 AM Office: [phone] Fax: [phone] Pager: [phone]

## 2021-11-14 NOTE — Patient Instructions (Signed)
Do not shower today and tomorrow, you can take the regular dressing of prior to the shower, keep the Steri-Strips on until automatically follow-up within the next 1 week to 10 days.  Please call if there is any discharge or swelling or redness around the loop extracted area.

## 2022-01-07 ENCOUNTER — Other Ambulatory Visit: Payer: Self-pay | Admitting: Gastroenterology

## 2022-01-07 DIAGNOSIS — K529 Noninfective gastroenteritis and colitis, unspecified: Secondary | ICD-10-CM

## 2022-01-26 ENCOUNTER — Ambulatory Visit: Payer: Medicare Other | Admitting: Cardiology

## 2022-02-26 ENCOUNTER — Ambulatory Visit: Payer: Medicare Other | Admitting: Cardiology

## 2022-02-26 ENCOUNTER — Encounter: Payer: Self-pay | Admitting: Cardiology

## 2022-02-26 VITALS — BP 134/64 | HR 62 | Temp 98.0°F | Resp 16 | Ht 60.0 in | Wt 115.0 lb

## 2022-02-26 DIAGNOSIS — I10 Essential (primary) hypertension: Secondary | ICD-10-CM

## 2022-02-26 DIAGNOSIS — E782 Mixed hyperlipidemia: Secondary | ICD-10-CM

## 2022-02-26 DIAGNOSIS — R55 Syncope and collapse: Secondary | ICD-10-CM

## 2022-02-26 NOTE — Progress Notes (Signed)
Patient referred by Haywood Pao, MD for syncope  Subjective:   Mary Leblanc, female    DOB: 08-03-1940, 81 y.o.   MRN: [id number]   Chief Complaint  Patient presents with   Hypertension   Loss of Consciousness   Follow-up    1 year   81 y.o. Caucasian female with hypertension, syncope  Patient is doing well. She walks regularly without any complaints of chest pain, shortness of breath. She has not had any recurrent syncope since being on colestipol for her GI symptoms. Loop recorder was explanted by Dr. Einar Gip at patient's request a few months ago.   Current Outpatient Medications:    colestipol (COLESTID) 1 g tablet, TAKE 1 TABLET BY MOUTH TWICE A DAY, Disp: 180 tablet, Rfl: 1   escitalopram (LEXAPRO) 20 MG tablet, Take 20 mg by mouth daily., Disp: , Rfl:    loperamide (IMODIUM) 2 MG capsule, Take 4 mg by mouth at bedtime., Disp: , Rfl:    omeprazole (PRILOSEC) 20 MG capsule, Take 20 mg by mouth daily., Disp: , Rfl:    Probiotic Product (PROBIOTIC PO), Take 1 capsule by mouth daily., Disp: , Rfl:    rosuvastatin (CRESTOR) 10 MG tablet, TAKE 1 TABLET BY MOUTH EVERY DAY, Disp: 90 tablet, Rfl: 1   valsartan (DIOVAN) 80 MG tablet, Take 80 mg by mouth daily., Disp: , Rfl:   Cardiovascular studies:  EKG 02/26/2022: Sinus rhythm 60 bpm  Normal EKG  Exercise treadmill stress test 12/09/2020: Exercise treadmill stress test performed using Bruce protocol.  Patient reached 8.4 METS, and 92% of age predicted maximum heart rate.  Exercise capacity was fair.  No chest pain reported.  Normal heart rate and hemodynamic response. Stress EKG revealed no ischemic changes. Low risk study.  Echocardiogram 06/01/2020:  Left ventricle cavity is normal in size and wall thickness. Normal global  wall motion. Normal LV systolic function with EF 66%. Doppler evidence of  grade I (impaired) diastolic dysfunction, normal LAP.  Mild tricuspid regurgitation.  No evidence of pulmonary  hypertension.  Recent labs: 09/01/2021: Glucose 133, BUN/Cr 17/1.04. EGFR 54. Na/K 133/3.9. Rest of the CMP normal H/H 11.6/34.9. MCV 97. Platelets 190 TSH 1.2 normal BNP 67 Trop HS 6,6  Review of Systems  Cardiovascular:  Negative for chest pain, dyspnea on exertion, leg swelling, palpitations and syncope.        Vitals:   02/26/22 1123 02/26/22 1124  BP:    Pulse:    Resp:    Temp:    SpO2: 96% 96%   Orthostatic VS for the past 72 hrs (Last 3 readings):  Orthostatic BP Patient Position BP Location Cuff Size Orthostatic Pulse  02/26/22 1124 131/66 Standing Left Arm Normal 63  02/26/22 1123 139/72 Sitting Left Arm Normal 61  02/26/22 1122 138/71 Supine Left Arm Normal 60     Objective:   Physical Exam Vitals and nursing note reviewed.  Constitutional:      General: She is not in acute distress. Neck:     Vascular: No JVD.  Cardiovascular:     Rate and Rhythm: Normal rate and regular rhythm.     Heart sounds: Normal heart sounds. No murmur heard. Pulmonary:     Effort: Pulmonary effort is normal.     Breath sounds: Normal breath sounds. No wheezing or rales.  Musculoskeletal:     Right lower leg: No edema.     Left lower leg: No edema.  Assessment & Recommendations:   81 y.o. Caucasian female with hypertension, syncope  Syncope: History, vasovagal syncope, with trigger being acute abdominal pain and large bowel movement. No recurrence.   Primary hypertension: Controlled  Mixed hyperlipidemia: Continue rosuvastatin 10 mg.  F/u in 1 year    Nigel Mormon, MD Pager: [phone] Office: [phone]

## 2022-03-07 ENCOUNTER — Other Ambulatory Visit: Payer: Self-pay | Admitting: Cardiology

## 2022-03-07 DIAGNOSIS — E782 Mixed hyperlipidemia: Secondary | ICD-10-CM

## 2022-04-16 ENCOUNTER — Ambulatory Visit (INDEPENDENT_AMBULATORY_CARE_PROVIDER_SITE_OTHER): Payer: Medicare Other | Admitting: Neurology

## 2022-04-16 ENCOUNTER — Encounter: Payer: Self-pay | Admitting: Neurology

## 2022-04-16 VITALS — BP 124/72 | HR 67 | Ht 60.0 in | Wt 117.8 lb

## 2022-04-16 DIAGNOSIS — G459 Transient cerebral ischemic attack, unspecified: Secondary | ICD-10-CM | POA: Diagnosis not present

## 2022-04-16 DIAGNOSIS — R55 Syncope and collapse: Secondary | ICD-10-CM

## 2022-04-16 NOTE — Progress Notes (Signed)
NEUROLOGY FOLLOW UP OFFICE NOTE  Mary Leblanc [id number] [birth year]  HISTORY OF PRESENT ILLNESS: I had the pleasure of seeing Mary Leblanc in follow-up in the neurology clinic on 04/16/2022.  The patient was last seen 6 months ago for dizziness, left-sided symptoms, and an episode of decreased responsiveness in 08/2021. She is alone in the office today. Records and images were personally reviewed where available.  Her 1-hour wake and sleep EEG in 09/2021 was normal. She was advised to start a daily baby aspirin but wanted to discuss today. She denies any further episodes of dizziness, left-sided weakness or spasms, or loss of consciousness since April 2023. She feels the cholestipol has helped her overall, she was able to walk today without any issues. She denies any headaches, vision changes, no falls.    History On Initial Assessment 09/26/2021: This is a very pleasant 81 year old right-handed woman with a history of hypertension, hyperlipidemia, syncope, IBS-D, presenting after ER visit on 09/01/2021 for dizziness and left-sided symptoms. Notes were reviewed. She used to walk 4 miles a day and had stopped for a time then recently got back into walking when that day she started feeling short of breath and even more fatigued. She went home and felt lightheaded, she lay down and took her BP, noting SBP 170, and it stayed between 160-165 for a number of hours. She called her PCP and was instructed to go to the ER. She denies any spinning sensation, no speech changes, or focal symptoms initially. While she was in the ER, she started having left facial twitching, her husband also reports her left leg was twitching. Her left side felt weak. Per notes, she had left-sided facial spasm, numbness, heaviness, and difficulty coordinating on the left. Her left hand felt colder than the right. She was evaluated by Neurology with normal exam. BP initially was 171/96. EKG showed NSR. She had a brain MRI without contrast  which I personally reviewed, no acute changes, there was mild chronic microvascular disease. CTA head and neck did not show any flow-limiting stenosis. No further similar episodes in the past month.   They report an episode of decreased responsiveness last 4/19, her husband shows a picture of her lying on the floor with eyes open, laundry around her. It took her a few minutes to come out of it. Her loop recorder did not show any abnormalities. She states that there was no warning that she could recall with this episode, in the past she has had intermittent syncope since her late teens where she could feel it happening and would feel lightheaded and get herself to the bed. Her husband denies any other staring/unresponsive episodes. She denies any other loss of time, no olfactory/gustatory hallucinations, myoclonic jerks. They report this episode occurred before she started the colestipol which has helped a lot with her diarrhea. She used to have migraines (strong family history) but recently has had occasional twinges of pain on the left frontal region with good response to acetaminophen. She occasionally notices difficulty swallowing. Her left hand still feels weaker, she occasionally notices an electric sensation in her left arm. She reports an average of 7-8 hours of interrupted sleep, she wakes up 3-4 times at night. She drinks an occasional glass of wine. Her mother has a history of stroke. Her sister had petit mal seizures in childhood that she outgrew. She denies any history of febrile seizures, significant head injuries, CNS infections.    PAST MEDICAL HISTORY: Past Medical History:  Diagnosis Date   Anemia    Depression    Encounter for loop recorder check 02/14/2019   GERD (gastroesophageal reflux disease)    HTN (hypertension)    Hyperlipidemia    Osteopenia    Syncope and collapse 01/22/2019    MEDICATIONS: Current Outpatient Medications on File Prior to Visit  Medication Sig Dispense  Refill   colestipol (COLESTID) 1 g tablet TAKE 1 TABLET BY MOUTH TWICE A DAY 180 tablet 1   escitalopram (LEXAPRO) 20 MG tablet Take 20 mg by mouth daily.     loperamide (IMODIUM) 2 MG capsule Take 4 mg by mouth at bedtime.     Multiple Vitamins-Minerals (ZINC PO) Take 50 mg by mouth daily.     omeprazole (PRILOSEC) 20 MG capsule Take 20 mg by mouth daily.     Probiotic Product (PROBIOTIC PO) Take 1 capsule by mouth daily.     rosuvastatin (CRESTOR) 10 MG tablet TAKE 1 TABLET BY MOUTH EVERY DAY 90 tablet 1   valsartan (DIOVAN) 80 MG tablet Take 80 mg by mouth daily.     Zinc 30 MG TABS 30 mg.     No current facility-administered medications on file prior to visit.    ALLERGIES: No Known Allergies  FAMILY HISTORY: Family History  Problem Relation Age of Onset   Hypertension Mother    Stroke Mother    Thyroid cancer Mother    Diabetes Mother    Heart attack Father    Prostate cancer Father    Kidney cancer Father    Clotting disorder Father    Diabetes Father    Non-Hodgkin's lymphoma Sister    Diabetes Brother    Heart disease Maternal Grandmother    Liver cancer Maternal Grandfather        not sure if it was liver or pancreatic cancer   Heart disease Paternal Grandmother    Heart Problems Paternal Grandfather 40   Colon cancer Neg Hx    Esophageal cancer Neg Hx    Pancreatic cancer Neg Hx     SOCIAL HISTORY: Social History   Socioeconomic History   Marital status: Married    Spouse name: Not on file   Number of children: 2   Years of education: Not on file   Highest education level: Not on file  Occupational History   Occupation: retired Charity fundraiser  Tobacco Use   Smoking status: Former    Packs/day: 1.00    Years: 10.00    Total pack years: 10.00    Types: Cigarettes    Quit date: 1968    Years since quitting: 55.9   Smokeless tobacco: Never  Vaping Use   Vaping Use: Never used  Substance and Sexual Activity   Alcohol use: Yes    Comment: occ-.5 per day.  stopped drinking 3/17   Drug use: Not Currently   Sexual activity: Yes  Other Topics Concern   Not on file  Social History Narrative   Right handed   Lives with husband   Social Determinants of Health   Financial Resource Strain: Not on file  Food Insecurity: Not on file  Transportation Needs: Not on file  Physical Activity: Not on file  Stress: Not on file  Social Connections: Not on file  Intimate Partner Violence: Not on file     PHYSICAL EXAM: Vitals:   04/16/22 1348  BP: 124/72  Pulse: 67  SpO2: 97%   General: No acute distress Head:  Normocephalic/atraumatic Skin/Extremities: No rash, no edema Neurological  Exam: alert and . No aphasia or dysarthria. Fund of knowledge is appropriate.  Recent and remote memory are intact.  Attention and concentration are normal.   Cranial nerves: Pupils equal, round. Extraocular movements intact with no nystagmus. Visual fields full.  No facial asymmetry.  Motor: Bulk and tone normal, muscle strength 5/5 throughout with no pronator drift.   Finger to nose testing intact.  Gait narrow-based and steady, able to tandem walk adequately.  Romberg negative.   IMPRESSION: This is a very pleasant 81 yo RH woman with a history of hypertension, hyperlipidemia, syncope, IBS-D, who was in the ER last 09/01/21 for dizziness and left-sided symptoms. Etiology unclear, MRI brain, CTA head and neck, loop recorder, and EEG normal. TIA is a possibility, although the shortness of breath prior to the event is unusual. We discussed starting a daily aspirin 81mg  in addition to continued control of vascular risk factors. No further syncopal episodes, felt to be vasovagal. Follow-up in 6 months, call for any changes.    Thank you for allowing me to participate in her care.  Please do not hesitate to call for any questions or concerns.    , M.D.   CC: Dr. Patrcia Dolly

## 2022-04-16 NOTE — Patient Instructions (Signed)
Good to see you doing well. Please start a daily baby aspirin (81mg ). Continue control of blood pressure, cholesterol, sugar levels. Follow-up in 6 months, call for any changes.

## 2022-07-08 ENCOUNTER — Other Ambulatory Visit: Payer: Self-pay | Admitting: Gastroenterology

## 2022-07-08 DIAGNOSIS — K529 Noninfective gastroenteritis and colitis, unspecified: Secondary | ICD-10-CM

## 2022-08-30 ENCOUNTER — Other Ambulatory Visit: Payer: Self-pay | Admitting: Cardiology

## 2022-08-30 DIAGNOSIS — E782 Mixed hyperlipidemia: Secondary | ICD-10-CM

## 2022-09-10 ENCOUNTER — Encounter: Payer: Self-pay | Admitting: Cardiology

## 2022-09-10 ENCOUNTER — Ambulatory Visit: Payer: Medicare Other | Admitting: Cardiology

## 2022-09-10 VITALS — BP 112/63 | HR 68 | Resp 16 | Ht 60.0 in | Wt 124.0 lb

## 2022-09-10 DIAGNOSIS — R072 Precordial pain: Secondary | ICD-10-CM

## 2022-09-10 DIAGNOSIS — R6889 Other general symptoms and signs: Secondary | ICD-10-CM | POA: Insufficient documentation

## 2022-09-10 DIAGNOSIS — R001 Bradycardia, unspecified: Secondary | ICD-10-CM | POA: Insufficient documentation

## 2022-09-10 DIAGNOSIS — R0683 Snoring: Secondary | ICD-10-CM | POA: Insufficient documentation

## 2022-09-10 DIAGNOSIS — R5383 Other fatigue: Secondary | ICD-10-CM

## 2022-09-10 NOTE — Progress Notes (Signed)
Patient referred by Gaspar Garbe, MD for syncope  Subjective:   Mary Leblanc, female    DOB: 1941/01/13, 82 y.o.   MRN: [id number]   Chief Complaint  Patient presents with   Bradycardia   Fatigue   Hypertension   Follow-up   82 y.o. Caucasian female with hypertension, decreased exercise tolerance  In the past 6 months, patient has had worsening fatigue symptoms and decreased exercise tolerance.  She previously used to walk up to 5 miles 5 times a week.  Now, she can barely walk 1 mile.  She has occasional chest tightness symptoms with activity.  On specific questioning, she does endorse snoring at night.  In addition, she reports that her legs feel heavy, without respiratory claudication symptoms.  Current Outpatient Medications:    aspirin EC 81 MG tablet, Take 81 mg by mouth daily. Swallow whole., Disp: , Rfl:    colestipol (COLESTID) 1 g tablet, TAKE 1 TABLET BY MOUTH TWICE A DAY, Disp: 180 tablet, Rfl: 0   escitalopram (LEXAPRO) 20 MG tablet, Take 20 mg by mouth daily., Disp: , Rfl:    loperamide (IMODIUM) 2 MG capsule, Take 4 mg by mouth at bedtime., Disp: , Rfl:    omeprazole (PRILOSEC) 20 MG capsule, Take 20 mg by mouth daily., Disp: , Rfl:    Probiotic Product (PROBIOTIC PO), Take 1 capsule by mouth daily., Disp: , Rfl:    rosuvastatin (CRESTOR) 10 MG tablet, TAKE 1 TABLET BY MOUTH EVERY DAY, Disp: 90 tablet, Rfl: 1   valsartan (DIOVAN) 80 MG tablet, Take 80 mg by mouth daily., Disp: , Rfl:   Cardiovascular studies:  EKG 09/10/2022: Sinus rhythm 59 bpm Normal EKG  Exercise treadmill stress test 12/09/2020: Exercise treadmill stress test performed using Bruce protocol.  Patient reached 8.4 METS, and 92% of age predicted maximum heart rate.  Exercise capacity was fair.  No chest pain reported.  Normal heart rate and hemodynamic response. Stress EKG revealed no ischemic changes. Low risk study.  Echocardiogram 06/01/2020:  Left ventricle cavity is normal in size and  wall thickness. Normal global  wall motion. Normal LV systolic function with EF 66%. Doppler evidence of  grade I (impaired) diastolic dysfunction, normal LAP.  Mild tricuspid regurgitation.  No evidence of pulmonary hypertension.  Recent labs: 09/03/2022: Glucose 108, BUN/Cr 27/1.1. EGFR 47. Na/K 138/5.7.  H/H 11.8/39.7. MCV 102.9. Platelets 186 TSH 1.4 normal Vit B12 487 normal  09/01/2021: Glucose 133, BUN/Cr 17/1.04. EGFR 54. Na/K 133/3.9. Rest of the CMP normal H/H 11.6/34.9. MCV 97. Platelets 190 TSH 1.2 normal BNP 67 Trop HS 6,6  Review of Systems  Cardiovascular:  Negative for chest pain, dyspnea on exertion, leg swelling, palpitations and syncope.        Vitals:   09/10/22 1536  BP: 112/63  Pulse: 68  Resp: 16  SpO2: 94%    Orthostatic VS for the past 72 hrs (Last 3 readings):  Patient Position BP Location Cuff Size  09/10/22 1536 Sitting Left Arm Normal     Objective:   Physical Exam Vitals and nursing note reviewed.  Constitutional:      General: She is not in acute distress. Neck:     Vascular: No JVD.  Cardiovascular:     Rate and Rhythm: Normal rate and regular rhythm.     Heart sounds: Normal heart sounds. No murmur heard. Pulmonary:     Effort: Pulmonary effort is normal.     Breath sounds: Normal breath sounds. No  wheezing or rales.  Musculoskeletal:     Right lower leg: No edema.     Left lower leg: No edema.           Assessment & Recommendations:   82 y.o. Caucasian female with hypertension, decreased exercise tolerance  Decreased exercise tolerance: Profound decrease in her exercise capacity.  Some precordial tightness with walking.  Will check echocardiogram and exercise nuclear stress test.  Fatigue: Unspecified fatigue symptoms with snoring.  Will refer for sleep study.  Primary hypertension: Controlled  Mixed hyperlipidemia: Continue rosuvastatin 10 mg.  F/u in 1 year    Elder Negus, MD Pager:  [phone] Office: [phone]

## 2022-09-13 ENCOUNTER — Encounter: Payer: Self-pay | Admitting: Neurology

## 2022-09-25 ENCOUNTER — Encounter: Payer: Self-pay | Admitting: Cardiology

## 2022-09-25 NOTE — Telephone Encounter (Signed)
From patient.

## 2022-09-27 ENCOUNTER — Ambulatory Visit: Payer: Medicare Other

## 2022-09-27 DIAGNOSIS — R072 Precordial pain: Secondary | ICD-10-CM

## 2022-09-27 DIAGNOSIS — R6889 Other general symptoms and signs: Secondary | ICD-10-CM

## 2022-09-28 ENCOUNTER — Ambulatory Visit (INDEPENDENT_AMBULATORY_CARE_PROVIDER_SITE_OTHER): Payer: Medicare Other | Admitting: Neurology

## 2022-09-28 ENCOUNTER — Encounter: Payer: Self-pay | Admitting: Neurology

## 2022-09-28 VITALS — BP 126/59 | HR 58 | Ht 60.0 in | Wt 119.4 lb

## 2022-09-28 DIAGNOSIS — G629 Polyneuropathy, unspecified: Secondary | ICD-10-CM | POA: Diagnosis not present

## 2022-09-28 DIAGNOSIS — R29898 Other symptoms and signs involving the musculoskeletal system: Secondary | ICD-10-CM | POA: Diagnosis not present

## 2022-09-28 DIAGNOSIS — M79601 Pain in right arm: Secondary | ICD-10-CM

## 2022-09-28 NOTE — Patient Instructions (Addendum)
Always good to see you.  Schedule EMG/NCV of the right arm and leg  2. Depending on results, we may do spine imaging  3. Continue with regular exercises  4. Follow-up after tests, call for any changes

## 2022-09-28 NOTE — Progress Notes (Signed)
NEUROLOGY FOLLOW UP OFFICE NOTE  Mary Leblanc [id number] [birth year]  HISTORY OF PRESENT ILLNESS: I had the pleasure of seeing Mary Leblanc in follow-up in the neurology clinic on 09/28/2022.  The patient was last seen 5 months ago for possible TIA when she had dizziness, left-sided symptoms in 08/2021. MRI brain, CTA head/neck, EEG, loop recorder normal. She presents today with new symptoms of leg heaviness (sometimes also affecting arms) that started a few months ago. Initially it was on and off, however she contacted our office earlier this month that over recent weeks, she is having pretty it pretty consistently. She used to walk 5 miles daily 5 days a week, she stopped long walks for a while but recently started doing 3 miles a day. She describes that after walking even down her hallway, her legs would start feeling awkward and heavy, as if her legs were stumps. No numbness/tingling. She sits and symptoms improve. They only occur when she is up and walking, she has to rest in between when doing her daily walks. When she is standing/cooking, she has pretty serious back pain. Sometimes her arms will feel "heavy or odd" as well. She has neck pain radiating to the right shoulder. She has a history of cervical laminoplasty in [place] 20 years ago and does have spinal stenosis. She denies any bowel/bladder incontinence/retention, no perineal numbness. She has IBS and poor sphincter control, when she has dumping from the IBS, she may pass out. No recent syncope. No falls.    History On Initial Assessment 09/26/2021: This is a very pleasant 82 year old right-handed woman with a history of hypertension, hyperlipidemia, syncope, IBS-D, presenting after ER visit on 09/01/2021 for dizziness and left-sided symptoms. Notes were reviewed. She used to walk 4 miles a day and had stopped for a time then recently got back into walking when that day she started feeling short of breath and even more fatigued. She went home and  felt lightheaded, she lay down and took her BP, noting SBP 170, and it stayed between 160-165 for a number of hours. She called her PCP and was instructed to go to the ER. She denies any spinning sensation, no speech changes, or focal symptoms initially. While she was in the ER, she started having left facial twitching, her husband also reports her left leg was twitching. Her left side felt weak. Per notes, she had left-sided facial spasm, numbness, heaviness, and difficulty coordinating on the left. Her left hand felt colder than the right. She was evaluated by Neurology with normal exam. BP initially was 171/96. EKG showed NSR. She had a brain MRI without contrast which I personally reviewed, no acute changes, there was mild chronic microvascular disease. CTA head and neck did not show any flow-limiting stenosis. No further similar episodes in the past month.   They report an episode of decreased responsiveness last 4/19, her husband shows a picture of her lying on the floor with eyes open, laundry around her. It took her a few minutes to come out of it. Her loop recorder did not show any abnormalities. She states that there was no warning that she could recall with this episode, in the past she has had intermittent syncope since her late teens where she could feel it happening and would feel lightheaded and get herself to the bed. Her husband denies any other staring/unresponsive episodes. She denies any other loss of time, no olfactory/gustatory hallucinations, myoclonic jerks. They report this episode occurred before she started  the colestipol which has helped a lot with her diarrhea. She used to have migraines (strong family history) but recently has had occasional twinges of pain on the left frontal region with good response to acetaminophen. She occasionally notices difficulty swallowing. Her left hand still feels weaker, she occasionally notices an electric sensation in her left arm. She reports an  average of 7-8 hours of interrupted sleep, she wakes up 3-4 times at night. She drinks an occasional glass of wine. Her mother has a history of stroke. Her sister had petit mal seizures in childhood that she outgrew. She denies any history of febrile seizures, significant head injuries, CNS infections.    PAST MEDICAL HISTORY: Past Medical History:  Diagnosis Date   Anemia    Depression    Encounter for loop recorder check 02/14/2019   GERD (gastroesophageal reflux disease)    HTN (hypertension)    Hyperlipidemia    Osteopenia    Syncope and collapse 01/22/2019    MEDICATIONS: Current Outpatient Medications on File Prior to Visit  Medication Sig Dispense Refill   aspirin EC 81 MG tablet Take 81 mg by mouth daily. Swallow whole.     b complex vitamins capsule Take 1 capsule by mouth daily.     calcium carbonate (OS-CAL - DOSED IN MG OF ELEMENTAL CALCIUM) 1250 (500 Ca) MG tablet Take 1 tablet by mouth.     colestipol (COLESTID) 1 g tablet TAKE 1 TABLET BY MOUTH TWICE A DAY 180 tablet 0   escitalopram (LEXAPRO) 20 MG tablet Take 20 mg by mouth daily.     loperamide (IMODIUM) 2 MG capsule Take 4 mg by mouth at bedtime.     omeprazole (PRILOSEC) 20 MG capsule Take 20 mg by mouth daily.     Probiotic Product (PROBIOTIC PO) Take 1 capsule by mouth daily.     rosuvastatin (CRESTOR) 10 MG tablet TAKE 1 TABLET BY MOUTH EVERY DAY 90 tablet 1   valsartan (DIOVAN) 80 MG tablet Take 80 mg by mouth daily.     No current facility-administered medications on file prior to visit.    ALLERGIES: No Known Allergies  FAMILY HISTORY: Family History  Problem Relation Age of Onset   Hypertension Mother    Stroke Mother    Thyroid cancer Mother    Diabetes Mother    Heart attack Father    Prostate cancer Father    Kidney cancer Father    Clotting disorder Father    Diabetes Father    Non-Hodgkin's lymphoma Sister    Diabetes Brother    Heart disease Maternal Grandmother    Liver cancer  Maternal Grandfather        not sure if it was liver or pancreatic cancer   Heart disease Paternal Grandmother    Heart Problems Paternal Grandfather 76   Colon cancer Neg Hx    Esophageal cancer Neg Hx    Pancreatic cancer Neg Hx     SOCIAL HISTORY: Social History   Socioeconomic History   Marital status: Married    Spouse name: Not on file   Number of children: 2   Years of education: Not on file   Highest education level: Not on file  Occupational History   Occupation: retired Charity fundraiser  Tobacco Use   Smoking status: Former    Packs/day: 1.00    Years: 10.00    Additional pack years: 0.00    Total pack years: 10.00    Types: Cigarettes    Quit date: 1968  Years since quitting: 73.4   Smokeless tobacco: Never  Vaping Use   Vaping Use: Never used  Substance and Sexual Activity   Alcohol use: Yes    Comment: occ-.5 per day.   Drug use: Not Currently   Sexual activity: Yes  Other Topics Concern   Not on file  Social History Narrative   Right handed   Lives with husband   Social Determinants of Health   Financial Resource Strain: Not on file  Food Insecurity: Not on file  Transportation Needs: Not on file  Physical Activity: Not on file  Stress: Not on file  Social Connections: Not on file  Intimate Partner Violence: Not on file     PHYSICAL EXAM: Vitals:   09/28/22 1059  BP: (!) 126/59  Pulse: (!) 58  SpO2: 94%   General: No acute distress Head:  Normocephalic/atraumatic Skin/Extremities: No rash, no edema Neurological Exam: alert and awake. No aphasia or dysarthria. Fund of knowledge is appropriate. Attention and concentration are normal.   Cranial nerves: Pupils equal, round. Extraocular movements intact with no nystagmus. Visual fields full.  No facial asymmetry.  Motor: Bulk and tone normal, muscle strength 5/5 throughout with no pronator drift. Sensation intact to all modalities on both UE. Decreased cold on both calves, decreased vibration sense to  ankles bilaterally. Intact pin sensation. Reflexes brisk +2 throughout except for +1 ankle jerks. Negative Hoffman sign. No ankle clonus. Toes downgoing.  Finger to nose testing intact.  Gait narrow-based and steady,she reports her legs started feeling heavy, she has some difficulty with tandem walk but able. Romberg negative.   IMPRESSION: This is a very pleasant 82 yo RH woman with a history of hypertension, hyperlipidemia, syncope, IBS-D, seen initially for dizziness and left-sided symptoms, workup negative, none since 09/02/22. She is on daily aspirin for possible TIA. She presents today with new symptoms of bilateral leg heaviness when walking, sometimes her arms are also affected. Neurological exam shows brisk reflexes and length-dependent neuropathy. Etiology of symptoms unclear. EMG/NCV of the right upper and lower extremity will be ordered to further evaluate symptoms. We may consider spine imaging depending on results. Continue regular exercises. Follow-up after tests, call for any changes.   Thank you for allowing me to participate in her care.  Please do not hesitate to call for any questions or concerns.  The duration of this appointment visit was 30 minutes of face-to-face time with the patient.  Greater than 50% of this time was spent in counseling, explanation of diagnosis, planning of further management, and coordination of care.   Patrcia Dolly, M.D.   CC: Dr. Wylene Simmer

## 2022-10-01 LAB — PCV MYOCARDIAL PERFUSION WO LEXISCAN
Angina Index: 0
ST Depression (mm): 0 mm

## 2022-10-02 ENCOUNTER — Encounter: Payer: Self-pay | Admitting: Cardiology

## 2022-10-03 ENCOUNTER — Other Ambulatory Visit: Payer: Self-pay | Admitting: Gastroenterology

## 2022-10-03 DIAGNOSIS — K529 Noninfective gastroenteritis and colitis, unspecified: Secondary | ICD-10-CM

## 2022-10-11 ENCOUNTER — Ambulatory Visit (INDEPENDENT_AMBULATORY_CARE_PROVIDER_SITE_OTHER): Payer: Medicare Other | Admitting: Neurology

## 2022-10-11 ENCOUNTER — Telehealth: Payer: Self-pay

## 2022-10-11 DIAGNOSIS — M5416 Radiculopathy, lumbar region: Secondary | ICD-10-CM

## 2022-10-11 DIAGNOSIS — R29898 Other symptoms and signs involving the musculoskeletal system: Secondary | ICD-10-CM

## 2022-10-11 DIAGNOSIS — G629 Polyneuropathy, unspecified: Secondary | ICD-10-CM

## 2022-10-11 DIAGNOSIS — M79601 Pain in right arm: Secondary | ICD-10-CM

## 2022-10-11 NOTE — Telephone Encounter (Signed)
Pt called an informed nerves testing overall looks very good.  No evidence of muscle disease or neuropathy.  Findings suggest that she may have a pinched nerve in the lumbar region which could be contributing to leg weakness.  Dr Allena Katz recommend that she start PT, if not already done.  If symptoms do not improve, then imaging would be the next step

## 2022-10-11 NOTE — Telephone Encounter (Signed)
-----   Message from Glendale Chard, DO sent at 10/11/2022  4:13 PM EDT ----- Please let pt know that nerves testing overall looks very good.  No evidence of muscle disease or neuropathy.  Findings suggest that she may have a pinched nerve in the lumbar region which could be contributing to leg weakness.  I recommend that she start PT, if not already done.  If symptoms do not improve, then imaging would be the next step.  Thank you.

## 2022-10-11 NOTE — Procedures (Signed)
[facility]  [address] [place], [address]  [place], [place] [address] Tel: [phone] Fax: [phone] Test Date:  10/11/2022  Patient: Mary Leblanc DOB: [birth year] Physician: Nita Sickle, DO  Sex: Female Height: 5\' 0"  Ref Phys: Patrcia Dolly, MD  ID#: [id number]   Technician:    History: This is a 82 year old female referred for evaluation of generalized weakness, arms and legs.  NCV & EMG Findings: Extensive electrodiagnostic testing of the right upper and lower extremity shows:  All sensory responses including the right median, ulnar, mixed palmar, sural, and superficial peroneal nerves are within normal limits. All motor responses including the right median, ulnar, peroneal, and tibial nerves are within normal limits. In the right upper extremity, there is no evidence of active or chronic motor axon loss changes affecting any of the tested muscles. In the right lower extremity, chronic motor axon loss changes are seen affecting the L3-4 myotome, without accompanying active denervation.  Impression: Chronic L3-4 radiculopathy affecting the right lower extremity, mild. There is no evidence of a diffuse myopathy, cervical radiculopathy, or large fiber sensorimotor polyneuropathy affecting the right side.   ___________________________ Nita Sickle, DO    Nerve Conduction Studies   Stim Site NR Peak (ms) Norm Peak (ms) O-P Amp (V) Norm O-P Amp  Right Median Anti Sensory (2nd Digit)  32 C  Wrist    3.6 <3.8 14.4 >10  Right Sup Peroneal Anti Sensory (Ant Lat Mall)  32 C  12 cm    2.5 <4.6 11.7 >3  Right Sural Anti Sensory (Lat Mall)  32 C  Calf    3.4 <4.6 11.0 >3  Right Ulnar Anti Sensory (5th Digit)  32 C  Wrist    2.8 <3.2 23.7 >5     Stim Site NR Onset (ms) Norm Onset (ms) O-P Amp (mV) Norm O-P Amp Site1 Site2 Delta-0 (ms) Dist (cm) Vel (m/s) Norm Vel (m/s)  Right Median Motor (Abd Poll Brev)  32 C  Wrist    3.0 <4.0 9.1 >5 Elbow Wrist 4.7 25.0 53 >50   Elbow    7.7  8.5         Right Peroneal Motor (Ext Dig Brev)  32 C  Ankle    4.4 <6.0 2.5 >2.5 B Fib Ankle 6.1 34.0 56 >40  B Fib    10.5  2.0  Poplt B Fib 1.4 8.0 57 >40  Poplt    11.9  1.8         Right Tibial Motor (Abd Hall Brev)  32 C  Ankle    3.2 <6.0 6.9 >4 Knee Ankle 7.9 38.0 48 >40  Knee    11.1  4.9         Right Ulnar Motor (Abd Dig Minimi)  32 C  Wrist    2.2 <3.1 9.3 >7 B Elbow Wrist 3.1 20.0 65 >50  B Elbow    5.3  9.1  A Elbow B Elbow 1.3 8.0 62 >50  A Elbow    6.6  8.5            Stim Site NR Peak (ms) Norm Peak (ms) P-T Amp (V) Site1 Site2 Delta-P (ms) Norm Delta (ms)  Right Median/Ulnar Palm Comparison (Wrist - 8cm)  32 C  Median Palm    2.0 <2.2 39.6 Median Palm Ulnar Palm 0.2   Ulnar Palm    1.8 <2.2 14.9       Electromyography   Side Muscle Ins.Act Fibs Fasc Recrt  Amp Dur Poly Activation Comment  Right 1stDorInt Nml Nml Nml Nml Nml Nml Nml Nml N/A  Right Abd Poll Brev Nml Nml Nml Nml Nml Nml Nml Nml N/A  Right PronatorTeres Nml Nml Nml Nml Nml Nml Nml Nml N/A  Right Biceps Nml Nml Nml Nml Nml Nml Nml Nml N/A  Right Triceps Nml Nml Nml Nml Nml Nml Nml Nml N/A  Right Deltoid Nml Nml Nml Nml Nml Nml Nml Nml N/A  Right AntTibialis Nml Nml Nml Nml Nml Nml Nml Nml N/A  Right Gastroc Nml Nml Nml Nml Nml Nml Nml Nml N/A  Right Flex Dig Long Nml Nml Nml Nml Nml Nml Nml Nml N/A  Right RectFemoris Nml Nml Nml *1- *1+ *1+ *1+ Nml N/A  Right BicepsFemS Nml Nml Nml Nml Nml Nml Nml Nml N/A  Right GluteusMed Nml Nml Nml Nml Nml Nml Nml Nml N/A  Right AdductorLong Nml Nml Nml *1- *1+ *1+ *1+ Nml N/A      Waveforms:

## 2022-10-12 ENCOUNTER — Ambulatory Visit: Payer: Medicare Other

## 2022-10-12 DIAGNOSIS — R6889 Other general symptoms and signs: Secondary | ICD-10-CM

## 2022-10-12 DIAGNOSIS — R072 Precordial pain: Secondary | ICD-10-CM

## 2022-10-17 ENCOUNTER — Institutional Professional Consult (permissible substitution): Payer: Medicare Other | Admitting: Neurology

## 2022-10-22 ENCOUNTER — Ambulatory Visit: Payer: Medicare Other | Attending: Neurology | Admitting: Physical Therapy

## 2022-10-22 DIAGNOSIS — M5459 Other low back pain: Secondary | ICD-10-CM | POA: Insufficient documentation

## 2022-10-22 DIAGNOSIS — R2689 Other abnormalities of gait and mobility: Secondary | ICD-10-CM | POA: Diagnosis present

## 2022-10-22 DIAGNOSIS — M5416 Radiculopathy, lumbar region: Secondary | ICD-10-CM | POA: Insufficient documentation

## 2022-10-22 DIAGNOSIS — M6281 Muscle weakness (generalized): Secondary | ICD-10-CM | POA: Diagnosis present

## 2022-10-22 DIAGNOSIS — R2681 Unsteadiness on feet: Secondary | ICD-10-CM | POA: Diagnosis present

## 2022-10-22 DIAGNOSIS — R29898 Other symptoms and signs involving the musculoskeletal system: Secondary | ICD-10-CM | POA: Diagnosis not present

## 2022-10-22 DIAGNOSIS — R279 Unspecified lack of coordination: Secondary | ICD-10-CM | POA: Diagnosis present

## 2022-10-22 NOTE — Therapy (Addendum)
OUTPATIENT PHYSICAL THERAPY THORACOLUMBAR EVALUATION   Patient Name: DAYANARA BRUSHABER MRN: [id number] DOB:11/05/1940, 82 y.o., female Today's Date: 10/22/2022  END OF SESSION:  PT End of Session - 10/22/22 1234     Visit Number 1    Number of Visits 7   Plus eval   Date for PT Re-Evaluation 12/10/22    Authorization Type Medicare    PT Start Time 1231    PT Stop Time 1318    PT Time Calculation (min) 47 min    Activity Tolerance Patient tolerated treatment well    Behavior During Therapy [facility] for tasks assessed/performed             Past Medical History:  Diagnosis Date   Anemia    Depression    Encounter for loop recorder check 02/14/2019   GERD (gastroesophageal reflux disease)    HTN (hypertension)    Hyperlipidemia    Osteopenia    Syncope and collapse 01/22/2019   Past Surgical History:  Procedure Laterality Date   ANAL RECTAL MANOMETRY N/A 08/15/2020   Procedure: ANO RECTAL MANOMETRY;  Surgeon: Shellia Cleverly, DO;  Location: [facility] ENDOSCOPY;  Service: Gastroenterology;  Laterality: N/A;   APPENDECTOMY  2000   BIOPSY  09/15/2020   Procedure: BIOPSY;  Surgeon: Lemar Lofty., MD;  Location: [facility] ENDOSCOPY;  Service: Gastroenterology;;   CERVICAL LAMINOPLASTY  2002   CHOLECYSTECTOMY  2013   ESOPHAGOGASTRODUODENOSCOPY (EGD) WITH PROPOFOL N/A 09/15/2020   Procedure: ESOPHAGOGASTRODUODENOSCOPY (EGD) WITH PROPOFOL;  Surgeon: Lemar Lofty., MD;  Location: [facility] ENDOSCOPY;  Service: Gastroenterology;  Laterality: N/A;   EUS N/A 09/15/2020   Procedure: UPPER ENDOSCOPIC ULTRASOUND (EUS) RADIAL;  Surgeon: Lemar Lofty., MD;  Location: [facility] ENDOSCOPY;  Service: Gastroenterology;  Laterality: N/A;   Patient Active Problem List   Diagnosis Date Noted   Bradycardia 09/10/2022   Precordial pain 09/10/2022   Fatigue 09/10/2022   Decreased exercise tolerance 09/10/2022   Snoring 09/10/2022   Dyssynergic defecation    Incontinence of feces    Hyponatremia  06/29/2020   Mixed hyperlipidemia 05/26/2020   Essential hypertension 05/26/2020   Encounter for loop recorder check 02/14/2019   Loop recorder Medtronic Linq 02/14/2019   Syncope and collapse 01/22/2019   Exertional chest pain 01/22/2019    PCP: Tisovec, Adelfa Koh, MD  REFERRING PROVIDER: Van Clines, MD  REFERRING DIAG: M54.16 (ICD-10-CM) - Lumbar radiculopathy R29.898 (ICD-10-CM) - Leg heaviness  Rationale for Evaluation and Treatment: Rehabilitation  THERAPY DIAG:  Muscle weakness (generalized) - Plan: PT plan of care cert/re-cert  Other abnormalities of gait and mobility - Plan: PT plan of care cert/re-cert  Unsteadiness on feet - Plan: PT plan of care cert/re-cert  Other low back pain - Plan: PT plan of care cert/re-cert  ONSET DATE: 10/11/2022 (referral)   SUBJECTIVE:  SUBJECTIVE STATEMENT: Pt reports her legs feel "wooden" when she walks. Has consistent back pain, especially after standing for long periods. Walks daily, walked for 5 miles yesterday. Feels "clumsy" when she walks. Denies falls but states she feels "off" if she moves too quickly occasionally.   PERTINENT HISTORY:  History of syncope due to dumping episodes, went to ED in 08/2021 due to TIA  PAIN:  Are you having pain? Yes: NPRS scale: 1-2/10 Pain location: Bilateral hands, low back, neck Pain description: Achy, throbbing  Aggravating factors: Standing for too long, lifting  Relieving factors: Pain meds   PRECAUTIONS: Fall  WEIGHT BEARING RESTRICTIONS: No  FALLS:  Has patient fallen in last 6 months? No  LIVING ENVIRONMENT: Lives with: lives with their spouse Lives in: House/apartment Stairs: Yes: External: 3 on the side, 8 in the front, 1 in back steps; Front steps have bilateral rails but cannot reach  rails, side steps do not have rails Has following equipment at home: Grab bars   PLOF: Independent  PATIENT GOALS: "I want to strengthen the muscles that I need to support my back< I want to find out (if we can) what is causing my legs to feel heavy"    OBJECTIVE:   DIAGNOSTIC FINDINGS:  MRI of brain on 09/02/2021   IMPRESSION: No acute intracranial process.  PATIENT SURVEYS:  Modified Oswestry 9/50 (mild disability)   SCREENING FOR RED FLAGS: Bowel or bladder incontinence: Yes: Pt has history of IBS-D Spinal tumors: No Cauda equina syndrome: No Compression fracture: No Abdominal aneurysm: No  COGNITION: Overall cognitive status: Within functional limits for tasks assessed     SENSATION: Pt denies numbness/tingling in BLEs  POSTURE: rounded shoulders and forward head   LUMBAR ROM: not assessed on eval   AROM eval  Flexion   Extension   Right lateral flexion   Left lateral flexion   Right rotation   Left rotation    (Blank rows = not tested)  LOWER EXTREMITY ROM:   not assessed on eval  Active  Right eval Left eval  Hip flexion    Hip extension    Hip abduction    Hip adduction    Hip internal rotation    Hip external rotation    Knee flexion    Knee extension    Ankle dorsiflexion    Ankle plantarflexion    Ankle inversion    Ankle eversion     (Blank rows = not tested)  LOWER EXTREMITY MMT:  Testing in seated position   MMT Right eval Left eval  Hip flexion 4- 4-  Hip extension    Hip abduction 5 5  Hip adduction 5 5  Hip internal rotation    Hip external rotation    Knee flexion 5 5  Knee extension 5 5  Ankle dorsiflexion 5 5  Ankle plantarflexion    Ankle inversion    Ankle eversion     (Blank rows = not tested)  FUNCTIONAL TESTS:  MCTSIB: Condition 1: Avg of 3 trials: 30 sec, Condition 2: Avg of 3 trials: 30 (moderate posterior sway) sec, Condition 3: Avg of 3 trials: 30 (minor lateral sway to R side) sec, Condition 4: Avg of 3  trials: 9.75, and 30 sec, and Total Score: 120/120   [facility] PT Assessment - 10/22/22 1307       Ambulation/Gait   Gait velocity 32.8' over 9.84s = 3.27ft/s no AD  GAIT: Distance walked: Various clinic distances  Assistive device utilized: None Level of assistance: Modified independence Comments: No instability noted   TODAY'S TREATMENT:        Next Session                                                                                                                           PATIENT EDUCATION:  Education details: POC, eval findings  Person educated: Patient Education method: Medical illustrator Education comprehension: verbalized understanding  HOME EXERCISE PROGRAM: To be established   ASSESSMENT:  CLINICAL IMPRESSION: Patient is a 82 year old female referred to Neuro OPPT for lumbar radiculopathy and leg heaviness. Pt's PMH is significant for: hypertension, hyperlipidemia, syncope, IBS-D. The following deficits were present during the exam: impaired balance, decreased activity tolerance and decreased BLE strength. Balance to be further assessed next session, but pt considered to have mild disability based on Modified Oswestry. Pt would benefit from skilled PT to address these impairments and functional limitations to maximize functional mobility independence.     OBJECTIVE IMPAIRMENTS: decreased activity tolerance, decreased balance, decreased endurance, difficulty walking, decreased strength, and pain  ACTIVITY LIMITATIONS: carrying, lifting, squatting, stairs, and locomotion level  PARTICIPATION LIMITATIONS: meal prep, cleaning, laundry, shopping, community activity, and yard work  PERSONAL FACTORS: Age, Past/current experiences, and 1 comorbidity: spinal stenosis  are also affecting patient's functional outcome.   REHAB POTENTIAL: Good  CLINICAL DECISION MAKING: Stable/uncomplicated  EVALUATION COMPLEXITY: Low   GOALS: Goals reviewed  with patient? Yes  SHORT TERM GOALS: Target date: 11/19/2022    Pt will be independent with initial HEP for improved strength, balance, transfers and gait.  Baseline: not established on eval  Goal status: INITIAL  2.  MiniBest to be assessed and LTG updated  Baseline:  Goal status: INITIAL    LONG TERM GOALS: Target date: 12/03/2022    Pt will improve Modified Oswestry to </= 4/50 for reduced subjective disability and reduced pain levels  Baseline: 9/50 (mild disability)  Goal status: INITIAL  2.  miniBest goal Baseline:  Goal status: INITIAL  3.  Pt will be independent with final HEP for improved strength, balance, transfers and gait.  Baseline:  Goal status: INITIAL   PLAN:  PT FREQUENCY: 1x/week  PT DURATION: 6 weeks  PLANNED INTERVENTIONS: Therapeutic exercises, Therapeutic activity, Neuromuscular re-education, Balance training, Gait training, Patient/Family education, Self Care, Joint mobilization, Stair training, Vestibular training, Canalith repositioning, Orthotic/Fit training, Aquatic Therapy, Dry Needling, Manual therapy, and Re-evaluation.  PLAN FOR NEXT SESSION: MiniBest and update goal. Initial HEP for BLE strength, deficits highlighted by minibest, core stability    Jill Alexanders Sharrod Achille, PT, DPT 10/22/2022, 8:47 PM

## 2022-10-29 ENCOUNTER — Ambulatory Visit: Payer: Medicare Other | Admitting: Neurology

## 2022-10-30 ENCOUNTER — Encounter: Payer: Self-pay | Admitting: Physical Therapy

## 2022-10-30 ENCOUNTER — Ambulatory Visit: Payer: Medicare Other | Admitting: Physical Therapy

## 2022-10-30 VITALS — BP 114/59 | HR 58

## 2022-10-30 DIAGNOSIS — R2689 Other abnormalities of gait and mobility: Secondary | ICD-10-CM

## 2022-10-30 DIAGNOSIS — R279 Unspecified lack of coordination: Secondary | ICD-10-CM

## 2022-10-30 DIAGNOSIS — M6281 Muscle weakness (generalized): Secondary | ICD-10-CM | POA: Diagnosis not present

## 2022-10-30 DIAGNOSIS — R2681 Unsteadiness on feet: Secondary | ICD-10-CM

## 2022-10-30 DIAGNOSIS — M5459 Other low back pain: Secondary | ICD-10-CM

## 2022-10-30 NOTE — Therapy (Signed)
OUTPATIENT PHYSICAL THERAPY THORACOLUMBAR TREATMENT   Patient Name: Mary Leblanc MRN: [id number] DOB:[birth year], 82 y.o., female Today's Date: 10/30/2022  END OF SESSION:  PT End of Session - 10/30/22 1535     Visit Number 2    Number of Visits 7    Date for PT Re-Evaluation 12/10/22    Authorization Type Medicare    PT Start Time 1532    PT Stop Time 1616    PT Time Calculation (min) 44 min    Equipment Utilized During Treatment Gait belt    Activity Tolerance Patient tolerated treatment well    Behavior During Therapy [facility] for tasks assessed/performed             Past Medical History:  Diagnosis Date   Anemia    Depression    Encounter for loop recorder check 02/14/2019   GERD (gastroesophageal reflux disease)    HTN (hypertension)    Hyperlipidemia    Osteopenia    Syncope and collapse 01/22/2019   Past Surgical History:  Procedure Laterality Date   ANAL RECTAL MANOMETRY N/A 08/15/2020   Procedure: ANO RECTAL MANOMETRY;  Surgeon: Shellia Cleverly, DO;  Location: [facility] ENDOSCOPY;  Service: Gastroenterology;  Laterality: N/A;   APPENDECTOMY  2000   BIOPSY  09/15/2020   Procedure: BIOPSY;  Surgeon: Lemar Lofty., MD;  Location: [facility] ENDOSCOPY;  Service: Gastroenterology;;   CERVICAL LAMINOPLASTY  2002   CHOLECYSTECTOMY  2013   ESOPHAGOGASTRODUODENOSCOPY (EGD) WITH PROPOFOL N/A 09/15/2020   Procedure: ESOPHAGOGASTRODUODENOSCOPY (EGD) WITH PROPOFOL;  Surgeon: Lemar Lofty., MD;  Location: [facility] ENDOSCOPY;  Service: Gastroenterology;  Laterality: N/A;   EUS N/A 09/15/2020   Procedure: UPPER ENDOSCOPIC ULTRASOUND (EUS) RADIAL;  Surgeon: Lemar Lofty., MD;  Location: [facility] ENDOSCOPY;  Service: Gastroenterology;  Laterality: N/A;   Patient Active Problem List   Diagnosis Date Noted   Bradycardia 09/10/2022   Precordial pain 09/10/2022   Fatigue 09/10/2022   Decreased exercise tolerance 09/10/2022   Snoring 09/10/2022   Dyssynergic defecation     Incontinence of feces    Hyponatremia 06/29/2020   Mixed hyperlipidemia 05/26/2020   Essential hypertension 05/26/2020   Encounter for loop recorder check 02/14/2019   Loop recorder Medtronic Linq 02/14/2019   Syncope and collapse 01/22/2019   Exertional chest pain 01/22/2019    PCP: Tisovec, Adelfa Koh, MD  REFERRING PROVIDER: Van Clines, MD  REFERRING DIAG: M54.16 (ICD-10-CM) - Lumbar radiculopathy R29.898 (ICD-10-CM) - Leg heaviness  Rationale for Evaluation and Treatment: Rehabilitation  THERAPY DIAG:  Muscle weakness (generalized)  Other abnormalities of gait and mobility  Unsteadiness on feet  Other low back pain  Unspecified lack of coordination  ONSET DATE: 10/11/2022 (referral)   SUBJECTIVE:  SUBJECTIVE STATEMENT: Pt reports that she was able to go for a walk this morning. Patient reports that she was able to walk 2 miles this morning with some deep breathing. She states that uphill her legs get a little strain.Denies falls/near falls.   PERTINENT HISTORY:  History of syncope due to dumping episodes, went to ED in 08/2021 due to TIA  PAIN:  Are you having pain? Yes: NPRS scale: 1-2/10 Pain location: Bilateral hands, low back, neck Pain description: Achy - the usual  Aggravating factors: Standing for too long, lifting  Relieving factors: Pain meds   PRECAUTIONS: Fall  WEIGHT BEARING RESTRICTIONS: No  FALLS:  Has patient fallen in last 6 months? No  LIVING ENVIRONMENT: Lives with: lives with their spouse Lives in: House/apartment Stairs: Yes: External: 3 on the side, 8 in the front, 1 in back steps; Front steps have bilateral rails but cannot reach rails, side steps do not have rails Has following equipment at home: Grab bars   PLOF: Independent  PATIENT GOALS:  "I want to strengthen the muscles that I need to support my back< I want to find out (if we can) what is causing my legs to feel heavy"    OBJECTIVE:   DIAGNOSTIC FINDINGS:  MRI of brain on 09/02/2021   IMPRESSION: No acute intracranial process.   TODAY'S TREATMENT:                                                                                           Vitals:   10/30/22 1542  BP: (!) 114/59  Pulse: (!) 58   TherAct:  [facility] PT Assessment - 10/30/22 0001       Standardized Balance Assessment   Standardized Balance Assessment Mini-BESTest      Mini-BESTest   Sit To Stand Normal: Comes to stand without use of hands and stabilizes independently.    Rise to Toes < 3 s.   mild imbalance and holds for 2 sec   Stand on one leg (left) Moderate: < 20 s   11 seconds on L leg   Stand on one leg (right) Normal: 20 s.    Stand on one leg - lowest score 1    Compensatory Stepping Correction - Forward Normal: Recovers independently with a single, large step (second realignement is allowed).    Compensatory Stepping Correction - Backward Moderate: More than one step is required to recover equilibrium    Compensatory Stepping Correction - Left Lateral Moderate: Several steps to recover equilibrium    Compensatory Stepping Correction - Right Lateral Moderate: Several steps to recover equilibrium    Stepping Corredtion Lateral - lowest score 1    Stance - Feet together, eyes open, firm surface  Normal: 30s    Stance - Feet together, eyes closed, foam surface  Moderate: < 30s   6 seconds (requires minA to recover)   Incline - Eyes Closed Normal: Stands independently 30s and aligns with gravity    Change in Gait Speed Normal: Significantly changes walkling speed without imbalance    Walk with head turns - Horizontal Moderate: performs head turns with reduction in gait speed.  Walk with pivot turns Normal: Turns with feet close FAST (< 3 steps) with good balance.    Step over obstacles Normal:  Able to step over box with minimal change of gait speed and with good balance.    Timed UP & GO with Dual Task Moderate: Dual Task affects either counting OR walking (>10%) when compared to the TUG without Dual Task.   22% difference 11 seconds with dual task, 9 seconds without dual task   Mini-BEST total score 20             Patient demonstrates increased fall risk as noted by score of 20/28 on the MiniBESTest <16/28= predictive of falls in elderly, <17.5/28= predictive of falls in stroke, <19/28= predictive of falls in PD, <19.5/28= benefit from use of AD in [place] MCID= 4 points  (Database of Knowledge Translation Tools Assessment Summary, 2017)    TherEx Home Exercises Reviewed during session:  - Single Leg Stance  - 2 sets - 20 seconds hold target (progressing to 20 seconds) - Romberg Stance Eyes Closed on Pillows  - 3 sets - 30 seconds hold - Supine 90/90 Alternating Toe Touch  - 2 sets - 10 reps  PATIENT EDUCATION:  Education details: miniBest results + initial HEP Person educated: Patient Education method: Medical illustrator (emailed patient HEP as printer was broken) Education comprehension: verbalized understanding  HOME EXERCISE PROGRAM: Access Code: [email or web address] URL: [email or web address] Date: 10/30/2022 Prepared by: Maryruth Eve  Exercises - Single Leg Stance  - 1 x daily - 7 x weekly - 3 sets - 20 seconds hold - Romberg Stance Eyes Closed on Foam Pad  - 1 x daily - 7 x weekly - 3 sets - 30 seconds hold - Supine 90/90 Alternating Toe Touch  - 1 x daily - 7 x weekly - 3 sets - 10 reps  ASSESSMENT:  CLINICAL IMPRESSION: Session emphasized creation of intial HEP with emphasis on core stability and findings of components of miniBEST. Patient demonstrates increased risk for falls on components of miniBEST and will benefit from areas of deficits including reactive balance, single leg stance, and integration of vestibular balance system. Continue POC,    OBJECTIVE IMPAIRMENTS: decreased activity tolerance, decreased balance, decreased endurance, difficulty walking, decreased strength, and pain  ACTIVITY LIMITATIONS: carrying, lifting, squatting, stairs, and locomotion level  PARTICIPATION LIMITATIONS: meal prep, cleaning, laundry, shopping, community activity, and yard work  PERSONAL FACTORS: Age, Past/current experiences, and 1 comorbidity: spinal stenosis  are also affecting patient's functional outcome.   REHAB POTENTIAL: Good  CLINICAL DECISION MAKING: Stable/uncomplicated  EVALUATION COMPLEXITY: Low   GOALS: Goals reviewed with patient? Yes  SHORT TERM GOALS: Target date: 11/19/2022    Pt will be independent with initial HEP for improved strength, balance, transfers and gait.  Baseline: not established on eval  Goal status: INITIAL  2.  MiniBest to be assessed and LTG updated  Baseline: Assessed on 6/25 Goal status: MET    LONG TERM GOALS: Target date: 12/03/2022    Pt will improve Modified Oswestry to </= 4/50 for reduced subjective disability and reduced pain levels  Baseline: 9/50 (mild disability)  Goal status: INITIAL  2. Pt will improve miniBEST score to 24/28 or greater to indicate improved static and dynamic balance to participate in higher level community activities.  Baseline:  Goal status: INITIAL  3.  Pt will be independent with final HEP for improved strength, balance, transfers and gait.  Baseline:  Goal status: INITIAL   PLAN:  PT FREQUENCY:  1x/week  PT DURATION: 6 weeks  PLANNED INTERVENTIONS: Therapeutic exercises, Therapeutic activity, Neuromuscular re-education, Balance training, Gait training, Patient/Family education, Self Care, Joint mobilization, Stair training, Vestibular training, Canalith repositioning, Orthotic/Fit training, Aquatic Therapy, Dry Needling, Manual therapy, and Re-evaluation.  PLAN FOR NEXT SESSION: Add to Initial HEP for BLE strength, deficits highlighted by  minibest, core stability, ask patient if she would like HEP printed as printer was not working last time  Carmelia Bake, PT, DPT 10/30/2022, 5:22 PM

## 2022-10-31 ENCOUNTER — Encounter: Payer: Self-pay | Admitting: Cardiology

## 2022-10-31 ENCOUNTER — Ambulatory Visit: Payer: Medicare Other | Admitting: Cardiology

## 2022-10-31 VITALS — BP 136/62 | HR 61 | Ht 60.0 in | Wt 122.0 lb

## 2022-10-31 DIAGNOSIS — R6889 Other general symptoms and signs: Secondary | ICD-10-CM

## 2022-10-31 NOTE — Progress Notes (Signed)
Patient referred by Gaspar Garbe, MD for syncope  Subjective:   Mary Leblanc, female    DOB: Mar 20, 1941, 82 y.o.   MRN: [id number]   Chief Complaint  Patient presents with   Decreased exercise tolerance   Follow-up   Results   82 y.o. Caucasian female with hypertension, decreased exercise tolerance  Patient is doing well, denies any specific chest pain or shortness of breath symptoms. Reviewed recent test results with the patient, details below.    Current Outpatient Medications:    aspirin EC 81 MG tablet, Take 81 mg by mouth daily. Swallow whole., Disp: , Rfl:    b complex vitamins capsule, Take 1 capsule by mouth daily., Disp: , Rfl:    calcium carbonate (OS-CAL - DOSED IN MG OF ELEMENTAL CALCIUM) 1250 (500 Ca) MG tablet, Take 1 tablet by mouth., Disp: , Rfl:    colestipol (COLESTID) 1 g tablet, TAKE 1 TABLET BY MOUTH TWICE A DAY, Disp: 180 tablet, Rfl: 0   escitalopram (LEXAPRO) 20 MG tablet, Take 20 mg by mouth daily., Disp: , Rfl:    loperamide (IMODIUM) 2 MG capsule, Take 4 mg by mouth at bedtime., Disp: , Rfl:    omeprazole (PRILOSEC) 20 MG capsule, Take 20 mg by mouth daily., Disp: , Rfl:    Probiotic Product (PROBIOTIC PO), Take 1 capsule by mouth daily., Disp: , Rfl:    rosuvastatin (CRESTOR) 10 MG tablet, TAKE 1 TABLET BY MOUTH EVERY DAY, Disp: 90 tablet, Rfl: 1   valsartan (DIOVAN) 80 MG tablet, Take 80 mg by mouth daily., Disp: , Rfl:   Cardiovascular studies:  EKG 09/10/2022: Sinus rhythm 59 bpm Normal EKG  Exercise nuclear stress test 09/27/2022: Myocardial perfusion is normal. Overall LV systolic function is normal without regional wall motion abnormalities. Stress LV EF: 65%.  Normal ECG stress. The patient exercised for 5 minutes and 13 seconds of a Bruce protocol, achieving approximately 7.05 METs & 101% MPHR. Normal BP response.  Compared to previous study: 03/09/2019, no significant change.  Low risk study.    Echocardiogram 10/12/2022:  Left  ventricle cavity is normal in size. Normal left ventricular wall  thickness. Normal global wall motion. Normal LV systolic function with EF  73%. Normal diastolic filling pattern.  Aneurysmal interatrial septum without 2D or color Doppler evidence of  shunting.  No significant valvular abnormality.  No evidence of pulmonary hypertension.  Previous study on 06/01/2020 reported mild TR, did not report aneurysmal  interatrial septum.   Recent labs: 09/03/2022: Glucose 108, BUN/Cr 27/1.1. EGFR 47. Na/K 138/5.7.  H/H 11.8/39.7. MCV 102.9. Platelets 186 TSH 1.4 normal Vit B12 487 normal  09/01/2021: Glucose 133, BUN/Cr 17/1.04. EGFR 54. Na/K 133/3.9. Rest of the CMP normal H/H 11.6/34.9. MCV 97. Platelets 190 TSH 1.2 normal BNP 67 Trop HS 6,6  Review of Systems  Cardiovascular:  Negative for chest pain, dyspnea on exertion, leg swelling, palpitations and syncope.        There were no vitals filed for this visit.   No data found.    Objective:   Physical Exam Vitals and nursing note reviewed.  Constitutional:      General: She is not in acute distress. Neck:     Vascular: No JVD.  Cardiovascular:     Rate and Rhythm: Normal rate and regular rhythm.     Heart sounds: Normal heart sounds. No murmur heard. Pulmonary:     Effort: Pulmonary effort is normal.     Breath sounds:  Normal breath sounds. No wheezing or rales.  Musculoskeletal:     Right lower leg: No edema.     Left lower leg: No edema.           Assessment & Recommendations:   82 y.o. Caucasian female with hypertension, decreased exercise tolerance  Decreased exercise tolerance: No specific abnormalities found on echocardiogram and exercise nuclear stress testing to explain her symptoms.  Suspect this could be due to deconditioning in the setting of known spinal stenosis.  She is currently working on physical therapy.  Continue the same.  Primary hypertension: Controlled  Mixed  hyperlipidemia: Continue rosuvastatin 10 mg.  F/u in 1 year    Elder Negus, MD Pager: [phone] Office: [phone]

## 2022-11-01 ENCOUNTER — Institutional Professional Consult (permissible substitution): Payer: Medicare Other | Admitting: Neurology

## 2022-11-05 ENCOUNTER — Ambulatory Visit: Payer: Medicare Other | Attending: Neurology | Admitting: Physical Therapy

## 2022-11-05 DIAGNOSIS — R2689 Other abnormalities of gait and mobility: Secondary | ICD-10-CM | POA: Diagnosis present

## 2022-11-05 DIAGNOSIS — M6281 Muscle weakness (generalized): Secondary | ICD-10-CM | POA: Diagnosis present

## 2022-11-05 DIAGNOSIS — R2681 Unsteadiness on feet: Secondary | ICD-10-CM | POA: Diagnosis present

## 2022-11-05 DIAGNOSIS — M5459 Other low back pain: Secondary | ICD-10-CM | POA: Insufficient documentation

## 2022-11-05 NOTE — Therapy (Signed)
OUTPATIENT PHYSICAL THERAPY THORACOLUMBAR TREATMENT   Patient Name: Mary Leblanc MRN: [id number] DOB:10-05-1940, 82 y.o., female Today's Date: 11/05/2022  END OF SESSION:  PT End of Session - 11/05/22 0807     Visit Number 3    Number of Visits 7    Date for PT Re-Evaluation 12/10/22    Authorization Type Medicare    PT Start Time 0804    PT Stop Time 0847    PT Time Calculation (min) 43 min    Equipment Utilized During Treatment --    Activity Tolerance Patient tolerated treatment well    Behavior During Therapy [facility] for tasks assessed/performed             Past Medical History:  Diagnosis Date   Anemia    Depression    Encounter for loop recorder check 02/14/2019   GERD (gastroesophageal reflux disease)    HTN (hypertension)    Hyperlipidemia    Osteopenia    Syncope and collapse 01/22/2019   Past Surgical History:  Procedure Laterality Date   ANAL RECTAL MANOMETRY N/A 08/15/2020   Procedure: ANO RECTAL MANOMETRY;  Surgeon: Shellia Cleverly, DO;  Location: [facility] ENDOSCOPY;  Service: Gastroenterology;  Laterality: N/A;   APPENDECTOMY  2000   BIOPSY  09/15/2020   Procedure: BIOPSY;  Surgeon: Lemar Lofty., MD;  Location: [facility] ENDOSCOPY;  Service: Gastroenterology;;   CERVICAL LAMINOPLASTY  2002   CHOLECYSTECTOMY  2013   ESOPHAGOGASTRODUODENOSCOPY (EGD) WITH PROPOFOL N/A 09/15/2020   Procedure: ESOPHAGOGASTRODUODENOSCOPY (EGD) WITH PROPOFOL;  Surgeon: Lemar Lofty., MD;  Location: [facility] ENDOSCOPY;  Service: Gastroenterology;  Laterality: N/A;   EUS N/A 09/15/2020   Procedure: UPPER ENDOSCOPIC ULTRASOUND (EUS) RADIAL;  Surgeon: Lemar Lofty., MD;  Location: [facility] ENDOSCOPY;  Service: Gastroenterology;  Laterality: N/A;   Patient Active Problem List   Diagnosis Date Noted   Bradycardia 09/10/2022   Precordial pain 09/10/2022   Fatigue 09/10/2022   Decreased exercise tolerance 09/10/2022   Snoring 09/10/2022   Dyssynergic defecation     Incontinence of feces    Hyponatremia 06/29/2020   Mixed hyperlipidemia 05/26/2020   Essential hypertension 05/26/2020   Encounter for loop recorder check 02/14/2019   Loop recorder Medtronic Linq 02/14/2019   Syncope and collapse 01/22/2019   Exertional chest pain 01/22/2019    PCP: Tisovec, Adelfa Koh, MD  REFERRING PROVIDER: Van Clines, MD  REFERRING DIAG: M54.16 (ICD-10-CM) - Lumbar radiculopathy R29.898 (ICD-10-CM) - Leg heaviness  Rationale for Evaluation and Treatment: Rehabilitation  THERAPY DIAG:  Muscle weakness (generalized)  Other abnormalities of gait and mobility  Unsteadiness on feet  Other low back pain  ONSET DATE: 10/11/2022 (referral)   SUBJECTIVE:  SUBJECTIVE STATEMENT: Pt reports she only walked one mile this week, is not happy with herself. HEP is going well. No falls.   PERTINENT HISTORY:  History of syncope due to dumping episodes, went to ED in 08/2021 due to TIA  PAIN:  Are you having pain? Yes: NPRS scale: 1-2/10 Pain location: Left hip Pain description: Achy - the usual  Aggravating factors: Standing for too long, lifting  Relieving factors: Pain meds   PRECAUTIONS: Fall  WEIGHT BEARING RESTRICTIONS: No  FALLS:  Has patient fallen in last 6 months? No  LIVING ENVIRONMENT: Lives with: lives with their spouse Lives in: House/apartment Stairs: Yes: External: 3 on the side, 8 in the front, 1 in back steps; Front steps have bilateral rails but cannot reach rails, side steps do not have rails Has following equipment at home: Grab bars   PLOF: Independent  PATIENT GOALS: "I want to strengthen the muscles that I need to support my back< I want to find out (if we can) what is causing my legs to feel heavy"    OBJECTIVE:   DIAGNOSTIC FINDINGS:  MRI  of brain on 09/02/2021   IMPRESSION: No acute intracranial process.  VITALS  There were no vitals filed for this visit.    TODAY'S TREATMENT:                                                                                          Ther Ex  SciFit multi-peaks level 6 for 8 minutes using intermittent BUEs and BLEs for neural priming for reciprocal movement, dynamic cardiovascular warmup and increased amplitude of stepping. Cued pt to maintain steps/min >70 throughout. Pt able to maintain steps/min >100.  The following were added to HEP for improved core stability, hip mobility and piriformis stretch:   Supine figure 4 piriformis stretch, 2x60s per side, for improved hip mobility and low back pain modulation.  Pigeon stretch, x2 minutes per side  Bird dogs, x10 per side  At // bars, pallof presses using orange resistance band, x8 reps per side, for functional core stability.   PATIENT EDUCATION:  Education details: Additions to HEP  Person educated: Patient Education method: Explanation, Demonstration, and Handouts  Education comprehension: verbalized understanding and returned demonstration  HOME EXERCISE PROGRAM: Access Code: [email or web address] URL: [email or web address] Date: 10/30/2022 Prepared by: Maryruth Eve  Exercises - Single Leg Stance  - 1 x daily - 7 x weekly - 3 sets - 20 seconds hold - Romberg Stance Eyes Closed on Foam Pad  - 1 x daily - 7 x weekly - 3 sets - 30 seconds hold - Supine 90/90 Alternating Toe Touch  - 1 x daily - 7 x weekly - 3 sets - 10 reps - Supine Figure 4 Piriformis Stretch  - 1 x daily - 7 x weekly - 3 sets - 30-60 second hold - Pigeon Pose  - 1 x daily - 7 x weekly - 2 sets - 1-2 minute hold - Bird Dog  - 1 x daily - 7 x weekly - 2 sets - 10 reps  Verbally added pallof presses and provided green theraband on 7/1  ASSESSMENT:  CLINICAL  IMPRESSION: Emphasis of skilled PT session on core stability, hip mobility and overall functional  mobility. Pt reporting increased discomfort in L posterior hip this date, so added piriformis and hip ER stretches to HEP which pt enjoyed. Pt very challenged by bird dogs and pallof presses due to functional core weakness. Provided pt w/handout of HEP this date. Continue POC.    OBJECTIVE IMPAIRMENTS: decreased activity tolerance, decreased balance, decreased endurance, difficulty walking, decreased strength, and pain  ACTIVITY LIMITATIONS: carrying, lifting, squatting, stairs, and locomotion level  PARTICIPATION LIMITATIONS: meal prep, cleaning, laundry, shopping, community activity, and yard work  PERSONAL FACTORS: Age, Past/current experiences, and 1 comorbidity: spinal stenosis  are also affecting patient's functional outcome.   REHAB POTENTIAL: Good  CLINICAL DECISION MAKING: Stable/uncomplicated  EVALUATION COMPLEXITY: Low   GOALS: Goals reviewed with patient? Yes  SHORT TERM GOALS: Target date: 11/19/2022    Pt will be independent with initial HEP for improved strength, balance, transfers and gait.  Baseline: not established on eval  Goal status: INITIAL  2.  MiniBest to be assessed and LTG updated  Baseline: Assessed on 6/25 Goal status: MET    LONG TERM GOALS: Target date: 12/03/2022    Pt will improve Modified Oswestry to </= 4/50 for reduced subjective disability and reduced pain levels  Baseline: 9/50 (mild disability)  Goal status: INITIAL  2. Pt will improve miniBEST score to 24/28 or greater to indicate improved static and dynamic balance to participate in higher level community activities.  Baseline:  Goal status: INITIAL  3.  Pt will be independent with final HEP for improved strength, balance, transfers and gait.  Baseline:  Goal status: INITIAL   PLAN:  PT FREQUENCY: 1x/week  PT DURATION: 6 weeks  PLANNED INTERVENTIONS: Therapeutic exercises, Therapeutic activity, Neuromuscular re-education, Balance training, Gait training, Patient/Family  education, Self Care, Joint mobilization, Stair training, Vestibular training, Canalith repositioning, Orthotic/Fit training, Aquatic Therapy, Dry Needling, Manual therapy, and Re-evaluation.  PLAN FOR NEXT SESSION: Add to Initial HEP for BLE strength, deficits highlighted by minibest, core stability, single leg bridges, blaze pods on foam  Notnamed Scholz E Pailyn [place], PT, DPT 11/05/2022, 8:48 AM

## 2022-11-12 ENCOUNTER — Ambulatory Visit: Payer: Medicare Other | Admitting: Physical Therapy

## 2022-11-12 DIAGNOSIS — R2689 Other abnormalities of gait and mobility: Secondary | ICD-10-CM

## 2022-11-12 DIAGNOSIS — M5459 Other low back pain: Secondary | ICD-10-CM

## 2022-11-12 DIAGNOSIS — M6281 Muscle weakness (generalized): Secondary | ICD-10-CM

## 2022-11-12 NOTE — Therapy (Signed)
OUTPATIENT PHYSICAL THERAPY THORACOLUMBAR TREATMENT   Patient Name: Mary Leblanc MRN: [id number] DOB:31-Jan-1941, 82 y.o., female Today's Date: 11/12/2022  END OF SESSION:  PT End of Session - 11/12/22 0805     Visit Number 4    Number of Visits 7    Date for PT Re-Evaluation 12/10/22    Authorization Type Medicare    PT Start Time 0802    PT Stop Time 0844    PT Time Calculation (min) 42 min    Activity Tolerance Patient tolerated treatment well    Behavior During Therapy [facility] for tasks assessed/performed              Past Medical History:  Diagnosis Date   Anemia    Depression    Encounter for loop recorder check 02/14/2019   GERD (gastroesophageal reflux disease)    HTN (hypertension)    Hyperlipidemia    Osteopenia    Syncope and collapse 01/22/2019   Past Surgical History:  Procedure Laterality Date   ANAL RECTAL MANOMETRY N/A 08/15/2020   Procedure: ANO RECTAL MANOMETRY;  Surgeon: Shellia Cleverly, DO;  Location: [facility] ENDOSCOPY;  Service: Gastroenterology;  Laterality: N/A;   APPENDECTOMY  2000   BIOPSY  09/15/2020   Procedure: BIOPSY;  Surgeon: Lemar Lofty., MD;  Location: [facility] ENDOSCOPY;  Service: Gastroenterology;;   CERVICAL LAMINOPLASTY  2002   CHOLECYSTECTOMY  2013   ESOPHAGOGASTRODUODENOSCOPY (EGD) WITH PROPOFOL N/A 09/15/2020   Procedure: ESOPHAGOGASTRODUODENOSCOPY (EGD) WITH PROPOFOL;  Surgeon: Lemar Lofty., MD;  Location: [facility] ENDOSCOPY;  Service: Gastroenterology;  Laterality: N/A;   EUS N/A 09/15/2020   Procedure: UPPER ENDOSCOPIC ULTRASOUND (EUS) RADIAL;  Surgeon: Lemar Lofty., MD;  Location: [facility] ENDOSCOPY;  Service: Gastroenterology;  Laterality: N/A;   Patient Active Problem List   Diagnosis Date Noted   Bradycardia 09/10/2022   Precordial pain 09/10/2022   Fatigue 09/10/2022   Decreased exercise tolerance 09/10/2022   Snoring 09/10/2022   Dyssynergic defecation    Incontinence of feces    Hyponatremia 06/29/2020    Mixed hyperlipidemia 05/26/2020   Essential hypertension 05/26/2020   Encounter for loop recorder check 02/14/2019   Loop recorder Medtronic Linq 02/14/2019   Syncope and collapse 01/22/2019   Exertional chest pain 01/22/2019    PCP: Tisovec, Adelfa Koh, MD  REFERRING PROVIDER: Van Clines, MD  REFERRING DIAG: M54.16 (ICD-10-CM) - Lumbar radiculopathy R29.898 (ICD-10-CM) - Leg heaviness  Rationale for Evaluation and Treatment: Rehabilitation  THERAPY DIAG:  Muscle weakness (generalized)  Other abnormalities of gait and mobility  Other low back pain  ONSET DATE: 10/11/2022 (referral)   SUBJECTIVE:  SUBJECTIVE STATEMENT: Pt reports she worked on her exercises a little this weekend. Was doing the pallof presses wrong. Denies falls or acute changes   PERTINENT HISTORY:  History of syncope due to dumping episodes, went to ED in 08/2021 due to TIA  PAIN:  Are you having pain? Yes: NPRS scale: 1-2/10 Pain location: Left hip Pain description: Achy - the usual  Aggravating factors: Standing for too long, lifting  Relieving factors: Pain meds   PRECAUTIONS: Fall  WEIGHT BEARING RESTRICTIONS: No  FALLS:  Has patient fallen in last 6 months? No  LIVING ENVIRONMENT: Lives with: lives with their spouse Lives in: House/apartment Stairs: Yes: External: 3 on the side, 8 in the front, 1 in back steps; Front steps have bilateral rails but cannot reach rails, side steps do not have rails Has following equipment at home: Grab bars   PLOF: Independent  PATIENT GOALS: "I want to strengthen the muscles that I need to support my back< I want to find out (if we can) what is causing my legs to feel heavy"    OBJECTIVE:   DIAGNOSTIC FINDINGS:  MRI of brain on 09/02/2021   IMPRESSION: No acute  intracranial process.  VITALS  There were no vitals filed for this visit.    TODAY'S TREATMENT:                                                                                          Ther Ex  Supine iron crosses, x4 per side, for improved thoracic mobility and hamstring stretch. Added to HEP (see bolded below)  Child's pose to cobra flow, x6 reps, for improved mobility and pain modulation  Seated march over using 12# KB, x15 reps per side, for improved core stability and hip flex/add/abd strength. Increased difficulty performing on LLE > RLE. Added to HEP (see bolded below) Progressed to performing x15 reps per side while holding 4# weighted ball for added core stability challenge  Staggered stance RDLs w/12# KB, x12 reps per side, for improved posterior chain strength, hamstring mobility and single leg stability. Pt performed well and maintained neutral spine position without cues. Pt w/decreased ROM on LLE> RLE  PATIENT EDUCATION:  Education details: Additions to HEP  Person educated: Patient Education method: Explanation, Demonstration, and Handouts  Education comprehension: verbalized understanding and returned demonstration  HOME EXERCISE PROGRAM: Access Code: [email or web address] URL: [email or web address] Date: 10/30/2022 Prepared by: Maryruth Eve  Exercises - Single Leg Stance  - 1 x daily - 7 x weekly - 3 sets - 20 seconds hold - Romberg Stance Eyes Closed on Foam Pad  - 1 x daily - 7 x weekly - 3 sets - 30 seconds hold - Supine 90/90 Alternating Toe Touch  - 1 x daily - 7 x weekly - 3 sets - 10 reps - Supine Figure 4 Piriformis Stretch  - 1 x daily - 7 x weekly - 3 sets - 30-60 second hold - Pigeon Pose  - 1 x daily - 7 x weekly - 2 sets - 1-2 minute hold - Bird Dog  - 1 x daily - 7 x weekly - 2 sets - 10 reps -  Seated march over  - 1 x daily - 7 x weekly - 3 sets - 10 reps - Supine Straight Leg Lumbar Rotation Stretch  - 1 x daily - 7 x weekly - 4-6 reps - 15-30  second hold  Verbally added pallof presses and provided green theraband on 7/1  ASSESSMENT:  CLINICAL IMPRESSION: Emphasis of skilled PT session on spinal mobility, posterior chain strength and core stability. Pt tolerated session well w/no increase in pain. Pt continues to have increased hamstring and piriformis tightness on LLE> RLE but does reduce w/mobility work. Continue POC.   OBJECTIVE IMPAIRMENTS: decreased activity tolerance, decreased balance, decreased endurance, difficulty walking, decreased strength, and pain  ACTIVITY LIMITATIONS: carrying, lifting, squatting, stairs, and locomotion level  PARTICIPATION LIMITATIONS: meal prep, cleaning, laundry, shopping, community activity, and yard work  PERSONAL FACTORS: Age, Past/current experiences, and 1 comorbidity: spinal stenosis  are also affecting patient's functional outcome.   REHAB POTENTIAL: Good  CLINICAL DECISION MAKING: Stable/uncomplicated  EVALUATION COMPLEXITY: Low   GOALS: Goals reviewed with patient? Yes  SHORT TERM GOALS: Target date: 11/19/2022    Pt will be independent with initial HEP for improved strength, balance, transfers and gait.  Baseline: not established on eval  Goal status: INITIAL  2.  MiniBest to be assessed and LTG updated  Baseline: Assessed on 6/25 Goal status: MET    LONG TERM GOALS: Target date: 12/03/2022    Pt will improve Modified Oswestry to </= 4/50 for reduced subjective disability and reduced pain levels  Baseline: 9/50 (mild disability)  Goal status: INITIAL  2. Pt will improve miniBEST score to 24/28 or greater to indicate improved static and dynamic balance to participate in higher level community activities.  Baseline:  Goal status: INITIAL  3.  Pt will be independent with final HEP for improved strength, balance, transfers and gait.  Baseline:  Goal status: INITIAL   PLAN:  PT FREQUENCY: 1x/week  PT DURATION: 6 weeks  PLANNED INTERVENTIONS:  Therapeutic exercises, Therapeutic activity, Neuromuscular re-education, Balance training, Gait training, Patient/Family education, Self Care, Joint mobilization, Stair training, Vestibular training, Canalith repositioning, Orthotic/Fit training, Aquatic Therapy, Dry Needling, Manual therapy, and Re-evaluation.  PLAN FOR NEXT SESSION: Goal assessment. Add to Initial HEP for BLE strength, deficits highlighted by minibest, core stability, single leg bridges, blaze pods on foam  Mccade Sullenberger E Zaine Elsass, PT, DPT 11/12/2022, 8:45 AM

## 2022-11-19 ENCOUNTER — Ambulatory Visit: Payer: Medicare Other | Admitting: Physical Therapy

## 2022-11-19 DIAGNOSIS — R2681 Unsteadiness on feet: Secondary | ICD-10-CM

## 2022-11-19 DIAGNOSIS — M6281 Muscle weakness (generalized): Secondary | ICD-10-CM

## 2022-11-19 DIAGNOSIS — R2689 Other abnormalities of gait and mobility: Secondary | ICD-10-CM

## 2022-11-19 DIAGNOSIS — M5459 Other low back pain: Secondary | ICD-10-CM

## 2022-11-19 NOTE — Therapy (Signed)
OUTPATIENT PHYSICAL THERAPY THORACOLUMBAR TREATMENT   Patient Name: Mary Leblanc MRN: [id number] DOB:11/14/1940, 82 y.o., female Today's Date: 11/19/2022  END OF SESSION:  PT End of Session - 11/19/22 0805     Visit Number 5    Number of Visits 7    Date for PT Re-Evaluation 12/10/22    Authorization Type Medicare    PT Start Time 0803    PT Stop Time 0843    PT Time Calculation (min) 40 min    Activity Tolerance Patient tolerated treatment well    Behavior During Therapy [facility] for tasks assessed/performed               Past Medical History:  Diagnosis Date   Anemia    Depression    Encounter for loop recorder check 02/14/2019   GERD (gastroesophageal reflux disease)    HTN (hypertension)    Hyperlipidemia    Osteopenia    Syncope and collapse 01/22/2019   Past Surgical History:  Procedure Laterality Date   ANAL RECTAL MANOMETRY N/A 08/15/2020   Procedure: ANO RECTAL MANOMETRY;  Surgeon: Shellia Cleverly, DO;  Location: [facility] ENDOSCOPY;  Service: Gastroenterology;  Laterality: N/A;   APPENDECTOMY  2000   BIOPSY  09/15/2020   Procedure: BIOPSY;  Surgeon: Lemar Lofty., MD;  Location: [facility] ENDOSCOPY;  Service: Gastroenterology;;   CERVICAL LAMINOPLASTY  2002   CHOLECYSTECTOMY  2013   ESOPHAGOGASTRODUODENOSCOPY (EGD) WITH PROPOFOL N/A 09/15/2020   Procedure: ESOPHAGOGASTRODUODENOSCOPY (EGD) WITH PROPOFOL;  Surgeon: Lemar Lofty., MD;  Location: [facility] ENDOSCOPY;  Service: Gastroenterology;  Laterality: N/A;   EUS N/A 09/15/2020   Procedure: UPPER ENDOSCOPIC ULTRASOUND (EUS) RADIAL;  Surgeon: Lemar Lofty., MD;  Location: [facility] ENDOSCOPY;  Service: Gastroenterology;  Laterality: N/A;   Patient Active Problem List   Diagnosis Date Noted   Bradycardia 09/10/2022   Precordial pain 09/10/2022   Fatigue 09/10/2022   Decreased exercise tolerance 09/10/2022   Snoring 09/10/2022   Dyssynergic defecation    Incontinence of feces    Hyponatremia 06/29/2020    Mixed hyperlipidemia 05/26/2020   Essential hypertension 05/26/2020   Encounter for loop recorder check 02/14/2019   Loop recorder Medtronic Linq 02/14/2019   Syncope and collapse 01/22/2019   Exertional chest pain 01/22/2019    PCP: Tisovec, Adelfa Koh, MD  REFERRING PROVIDER: Van Clines, MD  REFERRING DIAG: M54.16 (ICD-10-CM) - Lumbar radiculopathy R29.898 (ICD-10-CM) - Leg heaviness  Rationale for Evaluation and Treatment: Rehabilitation  THERAPY DIAG:  Muscle weakness (generalized)  Other abnormalities of gait and mobility  Unsteadiness on feet  Other low back pain  ONSET DATE: 10/11/2022 (referral)   SUBJECTIVE:  SUBJECTIVE STATEMENT: Pt reports she is having bilateral hip pain, started a few days ago. Had a fall on Friday in [employer], was playing with her grandkids in a play house and hit her head on the roof and fell backwards. Denies injuries but does feel tender on her back. HEP is going well   PERTINENT HISTORY:  History of syncope due to dumping episodes, went to ED in 08/2021 due to TIA  PAIN:  Are you having pain? Yes: NPRS scale: 4/10 Pain location: Bilateral hips Pain description: Achy - the usual  Aggravating factors: Standing for too long, lifting  Relieving factors: Pain meds   PRECAUTIONS: Fall  WEIGHT BEARING RESTRICTIONS: No  FALLS:  Has patient fallen in last 6 months? No  LIVING ENVIRONMENT: Lives with: lives with their spouse Lives in: House/apartment Stairs: Yes: External: 3 on the side, 8 in the front, 1 in back steps; Front steps have bilateral rails but cannot reach rails, side steps do not have rails Has following equipment at home: Grab bars   PLOF: Independent  PATIENT GOALS: "I want to strengthen the muscles that I need to support my  back< I want to find out (if we can) what is causing my legs to feel heavy"    OBJECTIVE:   DIAGNOSTIC FINDINGS:  MRI of brain on 09/02/2021   IMPRESSION: No acute intracranial process.  VITALS  There were no vitals filed for this visit.    TODAY'S TREATMENT:                                                                                          Ther Ex  SciFit multi-peaks level 7 for 8 minutes using BUE/BLEs for dynamic cardiovascular warmup and global strength. RPE of 2/10 following activity.  Reviewed pallof presses from HEP w/orange resistance band, x10 reps per side. Mod multimodal cues for proper positioning and technique.  Supine dead bugs moving BLEs only w/TA activation, x10 reps per side, for improved functional core stability.   Seated hamstring stretch, 2x1 minute per side, as pt reported feeling a cramp in back of legs.  Single leg bridges, x5 per side, for improved posterior chain strength and functional core stability. Pt able to hold at top of rep for 5-10s. Min cues for TA activation throughout. Added to HEP (see bolded below)   PATIENT EDUCATION:  Education details: additions to HEP, plan to add more appointments at end of POC  Person educated: Patient Education method: Explanation, Demonstration, and Handouts  Education comprehension: verbalized understanding and returned demonstration  HOME EXERCISE PROGRAM: Access Code: [email or web address] URL: [email or web address] Date: 10/30/2022 Prepared by: Maryruth Eve  Exercises - Single Leg Stance  - 1 x daily - 7 x weekly - 3 sets - 20 seconds hold - Romberg Stance Eyes Closed on Foam Pad  - 1 x daily - 7 x weekly - 3 sets - 30 seconds hold - Supine 90/90 Alternating Toe Touch  - 1 x daily - 7 x weekly - 3 sets - 10 reps - Supine Figure 4 Piriformis Stretch  - 1 x daily - 7 x weekly - 3 sets - 30-60  second hold - [employer]  - 1 x daily - 7 x weekly - 2 sets - 1-2 minute hold - Bird Dog  - 1 x daily - 7 x  weekly - 2 sets - 10 reps - Seated march over  - 1 x daily - 7 x weekly - 3 sets - 10 reps - Supine Straight Leg Lumbar Rotation Stretch  - 1 x daily - 7 x weekly - 4-6 reps - 15-30 second hold - Single Leg Bridge  - 1 x daily - 7 x weekly - 3 sets - 10 reps  Verbally added pallof presses and provided green theraband on 7/1  ASSESSMENT:  CLINICAL IMPRESSION: Emphasis of skilled PT session on STG assessment, core stability and posterior chain strength. Pt has met 2/2 STGs, performing her HEP regularly and completing MiniBest assessment. Pt did have a fall last week due to playing in a child's size play house with her grandkids, but denies injuries. Pt requesting to work on core stability today, so session spent on functional core strength. Continue POC.    OBJECTIVE IMPAIRMENTS: decreased activity tolerance, decreased balance, decreased endurance, difficulty walking, decreased strength, and pain  ACTIVITY LIMITATIONS: carrying, lifting, squatting, stairs, and locomotion level  PARTICIPATION LIMITATIONS: meal prep, cleaning, laundry, shopping, community activity, and yard work  PERSONAL FACTORS: Age, Past/current experiences, and 1 comorbidity: spinal stenosis  are also affecting patient's functional outcome.   REHAB POTENTIAL: Good  CLINICAL DECISION MAKING: Stable/uncomplicated  EVALUATION COMPLEXITY: Low   GOALS: Goals reviewed with patient? Yes  SHORT TERM GOALS: Target date: 11/19/2022    Pt will be independent with initial HEP for improved strength, balance, transfers and gait.  Baseline: not established on eval  Goal status: MET  2.  MiniBest to be assessed and LTG updated  Baseline: Assessed on 6/25 Goal status: MET    LONG TERM GOALS: Target date: 12/03/2022    Pt will improve Modified Oswestry to </= 4/50 for reduced subjective disability and reduced pain levels  Baseline: 9/50 (mild disability)  Goal status: INITIAL  2. Pt will improve miniBEST score to  24/28 or greater to indicate improved static and dynamic balance to participate in higher level community activities.  Baseline:  Goal status: INITIAL  3.  Pt will be independent with final HEP for improved strength, balance, transfers and gait.  Baseline:  Goal status: INITIAL   PLAN:  PT FREQUENCY: 1x/week  PT DURATION: 6 weeks  PLANNED INTERVENTIONS: Therapeutic exercises, Therapeutic activity, Neuromuscular re-education, Balance training, Gait training, Patient/Family education, Self Care, Joint mobilization, Stair training, Vestibular training, Canalith repositioning, Orthotic/Fit training, Aquatic Therapy, Dry Needling, Manual therapy, and Re-evaluation.  PLAN FOR NEXT SESSION: Add to Initial HEP for BLE strength, deficits highlighted by minibest, core stability, single leg bridges, blaze pods on foam  Xee Hollman E Gurnoor Sloop, PT, DPT 11/19/2022, 8:44 AM

## 2022-11-26 ENCOUNTER — Ambulatory Visit: Payer: Medicare Other | Admitting: Physical Therapy

## 2022-11-26 DIAGNOSIS — M5459 Other low back pain: Secondary | ICD-10-CM

## 2022-11-26 DIAGNOSIS — R2689 Other abnormalities of gait and mobility: Secondary | ICD-10-CM

## 2022-11-26 DIAGNOSIS — M6281 Muscle weakness (generalized): Secondary | ICD-10-CM

## 2022-11-26 DIAGNOSIS — R2681 Unsteadiness on feet: Secondary | ICD-10-CM

## 2022-11-26 NOTE — Therapy (Signed)
OUTPATIENT PHYSICAL THERAPY THORACOLUMBAR TREATMENT   Patient Name: Mary Leblanc MRN: [id number] DOB:[birth year], 82 y.o., female Today's Date: 11/26/2022  END OF SESSION:  PT End of Session - 11/26/22 0810     Visit Number 6    Number of Visits 7    Date for PT Re-Evaluation 12/10/22    Authorization Type Medicare    PT Start Time 0810   Pt needing to use restroom   PT Stop Time 0848    PT Time Calculation (min) 38 min    Activity Tolerance Patient tolerated treatment well    Behavior During Therapy [facility] for tasks assessed/performed                Past Medical History:  Diagnosis Date   Anemia    Depression    Encounter for loop recorder check 02/14/2019   GERD (gastroesophageal reflux disease)    HTN (hypertension)    Hyperlipidemia    Osteopenia    Syncope and collapse 01/22/2019   Past Surgical History:  Procedure Laterality Date   ANAL RECTAL MANOMETRY N/A 08/15/2020   Procedure: ANO RECTAL MANOMETRY;  Surgeon: Shellia Cleverly, DO;  Location: [facility] ENDOSCOPY;  Service: Gastroenterology;  Laterality: N/A;   APPENDECTOMY  2000   BIOPSY  09/15/2020   Procedure: BIOPSY;  Surgeon: Lemar Lofty., MD;  Location: [facility] ENDOSCOPY;  Service: Gastroenterology;;   CERVICAL LAMINOPLASTY  2002   CHOLECYSTECTOMY  2013   ESOPHAGOGASTRODUODENOSCOPY (EGD) WITH PROPOFOL N/A 09/15/2020   Procedure: ESOPHAGOGASTRODUODENOSCOPY (EGD) WITH PROPOFOL;  Surgeon: Lemar Lofty., MD;  Location: [facility] ENDOSCOPY;  Service: Gastroenterology;  Laterality: N/A;   EUS N/A 09/15/2020   Procedure: UPPER ENDOSCOPIC ULTRASOUND (EUS) RADIAL;  Surgeon: Lemar Lofty., MD;  Location: [facility] ENDOSCOPY;  Service: Gastroenterology;  Laterality: N/A;   Patient Active Problem List   Diagnosis Date Noted   Bradycardia 09/10/2022   Precordial pain 09/10/2022   Fatigue 09/10/2022   Decreased exercise tolerance 09/10/2022   Snoring 09/10/2022   Dyssynergic defecation    Incontinence of  feces    Hyponatremia 06/29/2020   Mixed hyperlipidemia 05/26/2020   Essential hypertension 05/26/2020   Encounter for loop recorder check 02/14/2019   Loop recorder Medtronic Linq 02/14/2019   Syncope and collapse 01/22/2019   Exertional chest pain 01/22/2019    PCP: Tisovec, Adelfa Koh, MD  REFERRING PROVIDER: Van Clines, MD  REFERRING DIAG: M54.16 (ICD-10-CM) - Lumbar radiculopathy R29.898 (ICD-10-CM) - Leg heaviness  Rationale for Evaluation and Treatment: Rehabilitation  THERAPY DIAG:  Muscle weakness (generalized)  Other abnormalities of gait and mobility  Unsteadiness on feet  Other low back pain  ONSET DATE: 10/11/2022 (referral)   SUBJECTIVE:  SUBJECTIVE STATEMENT: Pt reports doing well, states she purchased some resistance bands to do her exercises at home. Denies pain today.   PERTINENT HISTORY:  History of syncope due to dumping episodes, went to ED in 08/2021 due to TIA  PAIN:  Are you having pain? Yes: NPRS scale: 4/10 Pain location: Bilateral hips Pain description: Achy - the usual  Aggravating factors: Standing for too long, lifting  Relieving factors: Pain meds   PRECAUTIONS: Fall  WEIGHT BEARING RESTRICTIONS: No  FALLS:  Has patient fallen in last 6 months? No  LIVING ENVIRONMENT: Lives with: lives with their spouse Lives in: House/apartment Stairs: Yes: External: 3 on the side, 8 in the front, 1 in back steps; Front steps have bilateral rails but cannot reach rails, side steps do not have rails Has following equipment at home: Grab bars   PLOF: Independent  PATIENT GOALS: "I want to strengthen the muscles that I need to support my back< I want to find out (if we can) what is causing my legs to feel heavy"    OBJECTIVE:   DIAGNOSTIC FINDINGS:  MRI  of brain on 09/02/2021   IMPRESSION: No acute intracranial process.  VITALS  There were no vitals filed for this visit.    TODAY'S TREATMENT:             Ther Act  Discussed continued "heaviness" that pt feels in BLEs and encouraged her to pursue lumbar spine MRI, as discussed w/Dr. Karel Jarvis at last neuro visit. Pt verbalized agreement and understanding.                                                                                Ther Ex  In // bars, 6" box step ups while holding 12# KB in contralateral hand, x12 reps per side, for improved single leg stability, glute/quad strength and functional core stability. No UE support throughout. Pt reported increased difficulty performing on RLE > LLE. SBA throughout. Added to HEP (see bolded below)  Alt lateral eccentric heel taps from 6" box while holding 12# KB in goblet position, x8 per side, for improved hip abd/ER strength and core stability. Min A required w/fatigue, especially when standing on RLE. Noted pt drifting to L side throughout, requiring cues to shift to R. Added to HEP (see bolded below)  The following treadmill training was completed for aerobic/neural priming, endurance, LE coordination and global strength.  - Warmup: 2:00 up to 1.7 mph. Noted scissoring of BLEs, RLE>LLE - HIIT: 4:00 30sec ON/OFF alternating green theraball ball kicks / regular gait at 1.7 mph. Pt frequently scuffing LLE on treadmill w/kicks, min cues to reduce scuffing of feet  -Cool down: 1 minute at 1.7 mph   PATIENT EDUCATION:  Education details: additions to HEP Person educated: Patient Education method: Explanation, Demonstration, and Handouts  Education comprehension: verbalized understanding and returned demonstration  HOME EXERCISE PROGRAM: Access Code: [email or web address] URL: [email or web address] Date: 10/30/2022 Prepared by: Maryruth Eve  Exercises - Single Leg Stance  - 1 x daily - 7 x weekly - 3 sets - 20 seconds hold - Romberg  Stance Eyes Closed on Foam Pad  - 1 x daily - 7 x weekly - 3  sets - 30 seconds hold - Supine 90/90 Alternating Toe Touch  - 1 x daily - 7 x weekly - 3 sets - 10 reps - Supine Figure 4 Piriformis Stretch  - 1 x daily - 7 x weekly - 3 sets - 30-60 second hold - [place]  - 1 x daily - 7 x weekly - 2 sets - 1-2 minute hold - Bird Dog  - 1 x daily - 7 x weekly - 2 sets - 10 reps - Seated march over  - 1 x daily - 7 x weekly - 3 sets - 10 reps - Supine Straight Leg Lumbar Rotation Stretch  - 1 x daily - 7 x weekly - 4-6 reps - 15-30 second hold - Single Leg Bridge  - 1 x daily - 7 x weekly - 3 sets - 10 reps - Step Up with Pelvic Floor Contraction  - 1 x daily - 7 x weekly - 3 sets - 10 reps - Lateral Step Up with Counter Support  - 1 x daily - 7 x weekly - 3 sets - 10 reps  Verbally added pallof presses and provided green theraband on 7/1  ASSESSMENT:  CLINICAL IMPRESSION: Session limited due to pt's late start. Emphasis of skilled PT session on single leg stability, hip abduction/ER strength, LE coordination and functional core stability. Pt tolerated session well w/no report of pain but noted increased weakness in R hip > L hip w/single leg tasks. Also noted bilateral scissoring of gait that is exacerbated on treadmill vs overground ambulation. Continue POC.    OBJECTIVE IMPAIRMENTS: decreased activity tolerance, decreased balance, decreased endurance, difficulty walking, decreased strength, and pain  ACTIVITY LIMITATIONS: carrying, lifting, squatting, stairs, and locomotion level  PARTICIPATION LIMITATIONS: meal prep, cleaning, laundry, shopping, community activity, and yard work  PERSONAL FACTORS: Age, Past/current experiences, and 1 comorbidity: spinal stenosis  are also affecting patient's functional outcome.   REHAB POTENTIAL: Good  CLINICAL DECISION MAKING: Stable/uncomplicated  EVALUATION COMPLEXITY: Low   GOALS: Goals reviewed with patient? Yes  SHORT TERM GOALS: Target  date: 11/19/2022    Pt will be independent with initial HEP for improved strength, balance, transfers and gait.  Baseline: not established on eval  Goal status: MET  2.  MiniBest to be assessed and LTG updated  Baseline: Assessed on 6/25 Goal status: MET    LONG TERM GOALS: Target date: 12/03/2022    Pt will improve Modified Oswestry to </= 4/50 for reduced subjective disability and reduced pain levels  Baseline: 9/50 (mild disability)  Goal status: INITIAL  2. Pt will improve miniBEST score to 24/28 or greater to indicate improved static and dynamic balance to participate in higher level community activities.  Baseline:  Goal status: INITIAL  3.  Pt will be independent with final HEP for improved strength, balance, transfers and gait.  Baseline:  Goal status: INITIAL   PLAN:  PT FREQUENCY: 1x/week  PT DURATION: 6 weeks  PLANNED INTERVENTIONS: Therapeutic exercises, Therapeutic activity, Neuromuscular re-education, Balance training, Gait training, Patient/Family education, Self Care, Joint mobilization, Stair training, Vestibular training, Canalith repositioning, Orthotic/Fit training, Aquatic Therapy, Dry Needling, Manual therapy, and Re-evaluation.  PLAN FOR NEXT SESSION: Recert and check goals. Hip abduction strength. Add to Initial HEP for BLE strength, deficits highlighted by minibest, core stability, single leg bridges, blaze pods on foam  Ceceilia Cephus E Hilliard Borges, PT, DPT 11/26/2022, 8:51 AM

## 2022-12-03 ENCOUNTER — Ambulatory Visit: Payer: Medicare Other | Admitting: Physical Therapy

## 2022-12-03 ENCOUNTER — Encounter: Payer: Self-pay | Admitting: Physical Therapy

## 2022-12-03 NOTE — Therapy (Signed)
[facility] [address] [place], [place], [address] Phone: [phone]   Fax:  [phone]  Patient Details  Name: Mary Leblanc MRN: [id number] Date of Birth: 04-20-1941 Referring Provider:  No ref. provider found  Encounter Date: 12/03/2022  PHYSICAL THERAPY DISCHARGE SUMMARY  Visits from Start of Care: 6  Current functional level related to goals / functional outcomes: Independent with all ADLs    Remaining deficits: Low fall risk   Education / Equipment: HEP   Patient agrees to discharge. Patient goals were  DC as pt called to cancel all remaining appointments . Patient is being discharged due to the patient's request.     Jill Alexanders Latreshia Beauchaine, PT, DPT 12/03/2022, 7:42 AM  [place] [facility] [address] [place], [place], [address] Phone: [phone]   Fax:  [phone]

## 2022-12-10 ENCOUNTER — Ambulatory Visit: Payer: Medicare Other | Admitting: Gastroenterology

## 2022-12-10 ENCOUNTER — Ambulatory Visit: Payer: Self-pay | Admitting: Physical Therapy

## 2023-01-02 ENCOUNTER — Other Ambulatory Visit: Payer: Self-pay | Admitting: Gastroenterology

## 2023-01-02 DIAGNOSIS — K529 Noninfective gastroenteritis and colitis, unspecified: Secondary | ICD-10-CM

## 2023-01-10 ENCOUNTER — Encounter: Payer: Self-pay | Admitting: Neurology

## 2023-01-10 ENCOUNTER — Ambulatory Visit (INDEPENDENT_AMBULATORY_CARE_PROVIDER_SITE_OTHER): Payer: Medicare Other | Admitting: Neurology

## 2023-01-10 VITALS — BP 146/74 | HR 55 | Ht 60.0 in | Wt 123.6 lb

## 2023-01-10 DIAGNOSIS — Z9189 Other specified personal risk factors, not elsewhere classified: Secondary | ICD-10-CM | POA: Diagnosis not present

## 2023-01-10 DIAGNOSIS — G4752 REM sleep behavior disorder: Secondary | ICD-10-CM

## 2023-01-10 DIAGNOSIS — R001 Bradycardia, unspecified: Secondary | ICD-10-CM

## 2023-01-10 DIAGNOSIS — R0683 Snoring: Secondary | ICD-10-CM

## 2023-01-10 DIAGNOSIS — G478 Other sleep disorders: Secondary | ICD-10-CM

## 2023-01-10 DIAGNOSIS — G4719 Other hypersomnia: Secondary | ICD-10-CM | POA: Diagnosis not present

## 2023-01-10 DIAGNOSIS — R519 Headache, unspecified: Secondary | ICD-10-CM

## 2023-01-10 DIAGNOSIS — R6889 Other general symptoms and signs: Secondary | ICD-10-CM

## 2023-01-10 DIAGNOSIS — R351 Nocturia: Secondary | ICD-10-CM

## 2023-01-10 NOTE — Patient Instructions (Signed)

## 2023-01-10 NOTE — Progress Notes (Signed)
Subjective:    Patient ID: Mary Leblanc is a 82 y.o. female.  HPI    Mary Foley, MD, PhD [facility] [address] [address] [place], [place] [address]  Dear Dr. Rosemary Leblanc,  I saw your patient, Mary Leblanc, upon your kind request in my sleep clinic today for initial consultation of her sleep disorder, in particular, concern for underlying obstructive sleep apnea.  The patient is unaccompanied today.  As you know, Mary Leblanc is an 82 year old female with an underlying medical history of hypertension, bradycardia, hyperlipidemia, reflux disease, osteopenia, history of syncope, anemia, depression, lumbar radiculopathy (followed by [facility] neurology), who reports snoring and excessive daytime somnolence.  Her Epworth sleepiness score is 3 out of 24, fatigue severity score is 53 out of 63.  The Epworth sleepiness score may be an underestimation of her sleepiness because she does admit to taking a nap every day, typically around 2 hours long.  Her snoring can be loud and disturbing to her husband.  She is not aware of any family history of sleep apnea.  She has occasional nighttime leg cramps.  She tries to eat a banana every day.  She is a retired Char[employer] fundraiser.  She lives with her husband, between the 2 of them they have a total of 7 children.  She does not wake up fully rested.  Bedtime is generally around 11 PM.  She does not have a TV in the bedroom.  Rise time is around 10 AM.  She has significant nocturia about 3 times per average night and denies recurrent nocturnal or morning headaches but has had headaches in the afternoons at times.  She does not wake up gasping for air.  She has not been observed to have apneas.  She quit smoking in 1969.  She drinks alcohol occasionally in the form of wine, about 4 ounces at a time.  She does not drink caffeine daily.  They have no pets at the house.  I reviewed your office note from 09/10/2022.  Her Past Medical History Is Significant  For: Past Medical History:  Diagnosis Date   Anemia    Depression    Encounter for loop recorder check 02/14/2019   GERD (gastroesophageal reflux disease)    HTN (hypertension)    Hyperlipidemia    Osteopenia    Syncope and collapse 01/22/2019    Her Past Surgical History Is Significant For: Past Surgical History:  Procedure Laterality Date   ANAL RECTAL MANOMETRY N/A 08/15/2020   Procedure: ANO RECTAL MANOMETRY;  Surgeon: Shellia Cleverly, DO;  Location: [facility] ENDOSCOPY;  Service: Gastroenterology;  Laterality: N/A;   APPENDECTOMY  2000   BIOPSY  09/15/2020   Procedure: BIOPSY;  Surgeon: Lemar Lofty., MD;  Location: [facility] ENDOSCOPY;  Service: Gastroenterology;;   CERVICAL LAMINOPLASTY  2002   CHOLECYSTECTOMY  2013   ESOPHAGOGASTRODUODENOSCOPY (EGD) WITH PROPOFOL N/A 09/15/2020   Procedure: ESOPHAGOGASTRODUODENOSCOPY (EGD) WITH PROPOFOL;  Surgeon: Lemar Lofty., MD;  Location: [facility] ENDOSCOPY;  Service: Gastroenterology;  Laterality: N/A;   EUS N/A 09/15/2020   Procedure: UPPER ENDOSCOPIC ULTRASOUND (EUS) RADIAL;  Surgeon: Lemar Lofty., MD;  Location[facility] ENDOSCOPY;  Service: Gastroenterology;  Laterality: N/A;    Her Family History Is Significant For: Family History  Problem Relation Age of Onset   Hypertension Mother    Stroke Mother    Thyroid cancer Mother    Diabetes Mother    Heart attack Father    Prostate cancer Father  Kidney cancer Father    Clotting disorder Father    Diabetes Father    Non-Hodgkin's lymphoma Sister    Diabetes Brother    Heart disease Maternal Grandmother    Liver cancer Maternal Grandfather        not sure if it was liver or pancreatic cancer   Heart disease Paternal Grandmother    Heart Problems Paternal Grandfather 21   Colon cancer Neg Hx    Esophageal cancer Neg Hx    Pancreatic cancer Neg Hx    Sleep apnea Neg Hx     Her Social History Is Significant For: Social History   Socioeconomic History   Marital  status: Married    Spouse name: Not on file   Number of children: 2   Years of education: Not on file   Highest education level: Not on file  Occupational History   Occupation: retired Charity fundraiser  Tobacco Use   Smoking status: Former    Current packs/day: 0.00    Average packs/day: 1 pack/day for 10.0 years (10.0 ttl pk-yrs)    Types: Cigarettes    Start date: 66    Quit date: 1968    Years since quitting: 56.7   Smokeless tobacco: Never  Vaping Use   Vaping status: Never Used  Substance and Sexual Activity   Alcohol use: Yes    Alcohol/week: 3.0 standard drinks of alcohol    Types: 3 Glasses of wine per week   Drug use: Not Currently   Sexual activity: Yes  Other Topics Concern   Not on file  Social History Narrative   Right handed   Lives with husband   Social Determinants of Health   Financial Resource Strain: Not on file  Food Insecurity: Not on file  Transportation Needs: Not on file  Physical Activity: Not on file  Stress: Not on file  Social Connections: Not on file    Her Allergies Are:  No Known Allergies:   Her Current Medications Are:  Outpatient Encounter Medications as of 01/10/2023  Medication Sig   aspirin EC 81 MG tablet Take 81 mg by mouth daily. Swallow whole.   b complex vitamins capsule Take 1 capsule by mouth daily.   calcium carbonate (OS-CAL - DOSED IN MG OF ELEMENTAL CALCIUM) 1250 (500 Ca) MG tablet Take 1 tablet by mouth.   colestipol (COLESTID) 1 g tablet TAKE 1 TABLET BY MOUTH TWICE A DAY   escitalopram (LEXAPRO) 20 MG tablet Take 20 mg by mouth daily.   loperamide (IMODIUM) 2 MG capsule Take 4 mg by mouth at bedtime.   omeprazole (PRILOSEC) 20 MG capsule Take 20 mg by mouth daily.   Probiotic Product (PROBIOTIC PO) Take 1 capsule by mouth daily.   rosuvastatin (CRESTOR) 10 MG tablet TAKE 1 TABLET BY MOUTH EVERY DAY   tiZANidine (ZANAFLEX) 2 MG tablet Take 2-4 mg by mouth at bedtime as needed.   valsartan (DIOVAN) 80 MG tablet Take 80 mg by  mouth daily.   No facility-administered encounter medications on file as of 01/10/2023.  :   Review of Systems:  Out of a complete 14 point review of systems, all are reviewed and negative with the exception of these symptoms as listed below:  Review of Systems  Neurological:        Pt here for consult Pt snores,hypertension,fatigue,headaches Pt denies sleep study,CPAP machine     ESS: FSS:     Objective:  Neurological Exam  Physical Exam Physical Examination:   Vitals:  01/10/23 1322  BP: (!) 146/74  Pulse: (!) 55    General Examination: The patient is a very pleasant 82 y.o. female in no acute distress. She appears well-developed and well-nourished and well groomed.   HEENT: Normocephalic, atraumatic, pupils are equal, round and reactive to light, extraocular tracking is good without limitation to gaze excursion or nystagmus noted. Corrective eyeglasses in place. Hearing is grossly intact. Face is symmetric with normal facial animation. Speech is clear with no dysarthria noted. There is no hypophonia. There is no lip, neck/head, jaw or voice tremor. Neck is supple with full range of passive and active motion. There are no carotid bruits on auscultation. Oropharynx exam reveals: mild mouth dryness, good dental hygiene with bridge on top and partial dentures on the bottom, mild airway crowding, due to smaller airway entry and redundant soft palate. Mallampati is class I. Tongue protrudes centrally and palate elevates symmetrically. Tonsils are 1+ bilaterally.  Neck circumference 13-7/8 inches.  Minimal to mild overbite noted.    Chest: Clear to auscultation without wheezing, rhonchi or crackles noted.  Heart: S1+S2+0, regular and normal without murmurs, rubs or gallops noted.   Abdomen: Soft, non-tender and non-distended.  Extremities: There is no pitting edema in the distal lower extremities bilaterally.   Skin: Warm and dry without trophic changes noted.    Musculoskeletal: exam reveals no obvious joint deformities.   Neurologically:  Mental status: The patient is awake, alert and oriented in all 4 spheres. Her immediate and remote memory, attention, language skills and fund of knowledge are appropriate. There is no evidence of aphasia, agnosia, apraxia or anomia. Speech is clear with normal prosody and enunciation. Thought process is linear. Mood is normal and affect is normal.  Cranial nerves II - XII are as described above under HEENT exam.  Motor exam: Normal bulk, strength and tone is noted. There is no obvious action or resting tremor.  Fine motor skills and coordination: grossly intact.  Cerebellar testing: No dysmetria or intention tremor. There is no truncal or gait ataxia.  Sensory exam: intact to light touch in the upper and lower extremities.  Gait, station and balance: She stands easily. No veering to one side is noted. No leaning to one side is noted. Posture is age-appropriate and stance is narrow based. Gait shows normal stride length and normal pace. No problems turning are noted.   Assessment and Plan:   In summary, Mary Leblanc is a very pleasant 82 y.o.-year old female with an underlying medical history of hypertension, bradycardia, hyperlipidemia, reflux disease, osteopenia, history of syncope, anemia, depression, lumbar radiculopathy (followed by [facility] neurology), whose history and physical exam are concerning for sleep disordered breathing, particularly obstructive sleep apnea (OSA). A laboratory attended sleep study is typically considered "gold standard" for evaluation of sleep disordered breathing.   I had a long chat with the patient about my findings and the diagnosis of sleep apnea, particularly OSA, its prognosis and treatment options. We talked about medical/conservative treatments, surgical interventions and non-pharmacological approaches for symptom control. I explained, in particular, the risks and ramifications of  untreated moderate to severe OSA, especially with respect to developing cardiovascular disease down the road, including congestive heart failure (CHF), difficult to treat hypertension, cardiac arrhythmias (particularly A-fib), neurovascular complications including TIA, stroke and dementia. Even type 2 diabetes has, in part, been linked to untreated OSA. Symptoms of untreated OSA may include (but may not be limited to) daytime sleepiness, nocturia (i.e. frequent nighttime urination), memory problems, mood irritability  and suboptimally controlled or worsening mood disorder such as depression and/or anxiety, lack of energy, lack of motivation, physical discomfort, as well as recurrent headaches, especially morning or nocturnal headaches. We talked about the importance of maintaining a healthy lifestyle and striving for healthy weight. In addition, we talked about the importance of striving for and maintaining good sleep hygiene. I recommended a sleep study at this time. I outlined the differences between a laboratory attended sleep study which is considered more comprehensive and accurate over the option of a home sleep test (HST); the latter may lead to underestimation of sleep disordered breathing in some instances and does not help with diagnosing upper airway resistance syndrome and is not accurate enough to diagnose primary central sleep apnea typically. I outlined possible surgical and non-surgical treatment options of OSA, including the use of a positive airway pressure (PAP) device (i.e. CPAP, AutoPAP/APAP or BiPAP in certain circumstances), a custom-made dental device (aka oral appliance, which would require a referral to a specialist dentist or orthodontist typically, and is generally speaking not considered for patients with full dentures or edentulous state), upper airway surgical options, such as traditional UPPP (which is not considered a first-line treatment) or the Inspire device (hypoglossal nerve  stimulator, which would involve a referral for consultation with an ENT surgeon, after careful selection, following inclusion criteria - also not first-line treatment). I explained the PAP treatment option to the patient in detail, as this is generally considered first-line treatment.  The patient indicated that she would be willing to try PAP therapy, if the need arises. I explained the importance of being compliant with PAP treatment, not only for insurance purposes but primarily to improve patient's symptoms symptoms, and for the patient's long term health benefit, including to reduce Her cardiovascular risks longer-term.    We will pick up our discussion about the next steps and treatment options after testing.  We will keep her posted as to the test results by phone call and/or MyChart messaging where possible.  We will plan to follow-up in sleep clinic accordingly as well.  I answered all her questions today and the patient was in agreement.   I encouraged her to call with any interim questions, concerns, problems or updates or email [place] through MyChart.  Generally speaking, sleep test authorizations may take up to 2 weeks, sometimes less, sometimes longer, the patient is encouraged to get in touch with [place] if they do not hear back from the sleep lab staff directly within the next 2 weeks.  Thank you very much for allowing me to participate in the care of this nice patient. If I can be of any further assistance to you please do not hesitate to call me at [phone].  Sincerely,   Mary Foley, MD, PhD

## 2023-02-04 ENCOUNTER — Telehealth: Payer: Self-pay | Admitting: Neurology

## 2023-02-04 NOTE — Telephone Encounter (Signed)
[employer] & [place] no [place] req  [employer].

## 2023-02-27 ENCOUNTER — Other Ambulatory Visit: Payer: Self-pay | Admitting: Cardiology

## 2023-02-27 ENCOUNTER — Ambulatory Visit: Payer: Medicare Other | Admitting: Cardiology

## 2023-02-27 DIAGNOSIS — E782 Mixed hyperlipidemia: Secondary | ICD-10-CM

## 2023-03-05 ENCOUNTER — Ambulatory Visit (INDEPENDENT_AMBULATORY_CARE_PROVIDER_SITE_OTHER): Payer: Medicare Other | Admitting: Gastroenterology

## 2023-03-05 ENCOUNTER — Encounter: Payer: Self-pay | Admitting: Gastroenterology

## 2023-03-05 DIAGNOSIS — K8689 Other specified diseases of pancreas: Secondary | ICD-10-CM

## 2023-03-05 DIAGNOSIS — K219 Gastro-esophageal reflux disease without esophagitis: Secondary | ICD-10-CM

## 2023-03-05 DIAGNOSIS — K862 Cyst of pancreas: Secondary | ICD-10-CM

## 2023-03-05 DIAGNOSIS — R1310 Dysphagia, unspecified: Secondary | ICD-10-CM

## 2023-03-05 DIAGNOSIS — K529 Noninfective gastroenteritis and colitis, unspecified: Secondary | ICD-10-CM

## 2023-03-05 DIAGNOSIS — K580 Irritable bowel syndrome with diarrhea: Secondary | ICD-10-CM

## 2023-03-05 DIAGNOSIS — R109 Unspecified abdominal pain: Secondary | ICD-10-CM

## 2023-03-05 MED ORDER — NA SULFATE-K SULFATE-MG SULF 17.5-3.13-1.6 GM/177ML PO SOLN: 1.0000 | ORAL | 0 refills | Status: DC

## 2023-03-05 NOTE — Progress Notes (Signed)
Chief Complaint:    Dysphagia, abdominal discomfort, IBS-D, nausea  GI History: 82 yo with a past medical history of hypertension, hyperlipidemia, exertional chest pain, remote history of DVT, bradycardia status post loop recorder 12/2017, appendectomy, cholecystectomy 2013 and neck surgery, IBS-D, bowel incontinence    1) IBS-D.  Episodic lower abdominal pain with nonbloody diarrhea, which can be explosive at times.  Worries about leaving the house for fear of episode. - Has completed 6 or 7 colonoscopies in her lifetime, with a single polyp removed during 1 of those colonoscopies. - 10/15/2017: Colonoscopy at [facility] clinic in [place]: External hemorrhoids, no polyps.  No bxs. Repeat in 5 years - 07/06/2020: CT abdomen/pelvis: Normal GI tract.  PD diffusely dilated to the ampulla measuring 6 mm. Ccy with postoperative biliary dilation - 07/24/2020: MRI/MRCP: Very mild dilatation of main PD and CBD and intrahepatic biliary tree.  No pancreatic head mass, no definite ampullary lesion.  Mild hepatic steatosis - 08/15/2020: ARM: Weak anal sphincter pressures, some evidence of pelvic floor dyssynergia and hyposensitivity.  Referred to [facility] and PT - 09/15/2020: EUS: Hyperechoic stranding in entire pancreas, PD dilated in the pancreatic head, genu, body, and tail with proximal tapering.  Prominent PD sidebranches in body/tail with tortuous/ectatic appearance in genu transition to body.  Dilation of CBD and common hepatic duct.  No ampullary mass.  No malignant appearing lymph nodes.  Recommended repeat MRI/MRCP in 1 year - 02/01/2021: GI follow-up.  Ongoing episodic diarrhea with lower abdominal pain.  Trialed course of Florastor, IBgard.  Discussed changing PPI to H2 blocker. Ordered SIBO breath testing (never completed) - 02/02/2021: Follow-up in PT Clinic. Sxs improved (partially met goals). D/c from PT - 02/03/2021: Fecal calprotectin 111 (borderline), normal pancreatic elastase, TSH, BMP, TTG, IgA.  Calcium  10.6 - 06/08/2021: Follow-up in GI clinic.  Continued intermittent diarrhea, cramping, bloating.  Started rifaximin and probiotics with continued low FODMAP diet.  Discussed referral to Colorectal Surgery for week anal sphincter pressure. - 06/22/2021: Follow-up in GI clinic.  Continued fecal incontinence with urgency, bloating, increased flatus.  Started colestipol.  Again discussed potentially switching SSRI to TCA, which would be done in concert with PCM.  Ordered repeat MRCP.  Negative/normal GI PCR panel, C. difficile, CBC.  ALT 65, otherwise normal CMP.  Normal CRP, acute viral hep panel. - 07/06/2021: MRI abdomen: Normal liver, CBD.  Stable mild dilatation of PD measuring 4 mm in HOP without obstructing mass.  9 mm exophytic cyst in tail of pancreas which appears to communicate with PD.  Most likely sidebranch IPMN.  Recommend pre-/postcontrast MRI/MRCP in 2 years. - 08/23/2021: Follow-up in the GI clinic.  Improvement in diarrhea with colestipol and low FODMAP diet, now with formed stools.  HPI:     Patient is a 82 y.o. female presenting to the Gastroenterology Clinic for follow-up.  She was last seen by me on 08/23/2021.  Main issue today is dysphagia. Has been present "for a while"; last several months. Points to suprasternal notch. No hx of food impaction. No odynophagia.   GERD has otherwise been well controlled with Prilosec.   IBS-D otherwise had been largely well-controlled with colestipol, low FODMAP diet, and using Imodium on demand for breakthrough.  However, over last few months has had breakthrough sxs despite ongoing colestipol. Avoiding gluten-containing foods. Uses imodium rarely.  Will have days of up to 5 stools/day, sometimes loose.  Was seen in the Cardiology Clinic on 09/10/2022 for fatigue and decreased exercise tolerance.  Normal ECG  stress.  Normal TTE with EF 73%.  Continues to have fatigue.  Was seen by her [facility] for this issue last week with extended evaluation so far  unrevealing.  Recent Labs from [facility] reviewed on her phone today: - H/H 12.9/39.9 with MCV/RDW 99/15 - Normal WBC, PLT - AST/ALT 59/56. Normal Tbili. Alb 4.8 - Normal BUN/Creat - Ferritin 52, iron 80. TIBC 357, Sat 22% - B12: 487 - TSH 1.41 - ANA-, SSA/SSB -  Review of systems:     No chest pain, no SOB, no fevers, no urinary sx   Past Medical History:  Diagnosis Date   Anemia    Depression    Encounter for loop recorder check 02/14/2019   GERD (gastroesophageal reflux disease)    HTN (hypertension)    Hyperlipidemia    Osteopenia    Syncope and collapse 01/22/2019    Patient's surgical history, family medical history, social history, medications and allergies were all reviewed in Epic    Current Outpatient Medications  Medication Sig Dispense Refill   aspirin EC 81 MG tablet Take 81 mg by mouth daily. Swallow whole.     b complex vitamins capsule Take 1 capsule by mouth daily.     calcium carbonate (OS-CAL - DOSED IN MG OF ELEMENTAL CALCIUM) 1250 (500 Ca) MG tablet Take 1 tablet by mouth.     colestipol (COLESTID) 1 g tablet TAKE 1 TABLET BY MOUTH TWICE A DAY 180 tablet 0   escitalopram (LEXAPRO) 20 MG tablet Take 20 mg by mouth daily.     loperamide (IMODIUM) 2 MG capsule Take 4 mg by mouth at bedtime.     omeprazole (PRILOSEC) 20 MG capsule Take 20 mg by mouth daily.     Probiotic Product (PROBIOTIC PO) Take 1 capsule by mouth daily.     rosuvastatin (CRESTOR) 10 MG tablet TAKE 1 TABLET BY MOUTH EVERY DAY 90 tablet 1   tiZANidine (ZANAFLEX) 2 MG tablet Take 2-4 mg by mouth at bedtime as needed.     valsartan (DIOVAN) 80 MG tablet Take 80 mg by mouth daily.     No current facility-administered medications for this visit.    Physical Exam:     There were no vitals taken for this visit.  GENERAL:  Pleasant female in NAD PSYCH: : Cooperative, normal affect Musculoskeletal:  Normal muscle tone, normal strength NEURO: Alert and oriented x 3, no focal neurologic  deficits   IMPRESSION and PLAN:    1) Dysphagia Intermittent solid food dysphagia, pointing to suprasternal notch.  Reflux symptoms had otherwise been largely well-controlled on current therapy. - Plan for EGD with esophageal dilation and/or biopsies as appropriate - Continue cutting food into small pieces, chewing thoroughly, and drinking plenty of fluids with meals  2) GERD - Well-controlled on current therapy - Continue Prilosec - Continue antireflux lifestyle/dietary modifications - Will evaluate for erosive esophagitis, LES laxity, hiatal hernia time of EGD as above  4) IBS-D 5) Diarrhea 6) Abdominal cramping Increasing diarrhea and lower abdominal cramping recently.  Discussed IBS-D along with potential overlapping etiologies.  Last colonoscopy was at [facility] clinic in 2019 and had recommended repeat in 5 years. - Colonoscopy with random and directed biopsies - Continue colestipol - Add Benefiber - Continue low FODMAP diet - Ok to continue using Imodium on demand  7) Pancreatic cyst 8) Mildly dilated pancreatic duct - Pre and postcontrast MRI/MRCP in 07/2023   9) Fatigue - Recent normal CBC, iron panel, B12, TSH - Can evaluate for  e/o malabsorption at time of endoscopy with random and directed biopsies as appropriate - Continue follow-up with PCM      The indications, risks, and benefits of EGD and colonoscopy were explained to the patient in detail. Risks include but are not limited to bleeding, perforation, adverse reaction to medications, and cardiopulmonary compromise. Sequelae include but are not limited to the possibility of surgery, hospitalization, and mortality. The patient verbalized understanding and wished to proceed. All questions answered, referred to scheduler and bowel prep ordered. Further recommendations pending results of the exam.        Verlin Dike Krystalle Pilkington ,DO, FACG 03/05/2023, 1:33 PM

## 2023-03-05 NOTE — Patient Instructions (Addendum)
_______________________________________________________  If your blood pressure at your visit was 140/90 or greater, please contact your primary care physician to follow up on this. _______________________________________________________  If you are age 81 or older, your body mass index should be between 23-30. Your Body mass index is 24.68 kg/m. If this is out of the aforementioned range listed, please consider follow up with your Primary Care Provider. _______________________________________________________  The [facility] providers would like to encourage you to use [facility] to communicate with providers for non-urgent requests or questions.  Due to long hold times on the telephone, sending your provider a message by [facility] may be a faster and more efficient way to get a response.  Please allow 48 business hours for a response.  Please remember that this is for non-urgent requests.  _______________________________________________________  Please purchase the following medications over the counter and take as directed:  START: Benefiber daily  You have been scheduled for an endoscopy and colonoscopy. Please follow the written instructions given to you at your visit today.  Please pick up your prep supplies at the pharmacy within the next 1-3 days.  If you use inhalers (even only as needed), please bring them with you on the day of your procedure.  DO NOT TAKE 7 DAYS PRIOR TO TEST- Trulicity (dulaglutide) Ozempic, Wegovy (semaglutide) Mounjaro (tirzepatide) Bydureon Bcise (exanatide extended release)  DO NOT TAKE 1 DAY PRIOR TO YOUR TEST Rybelsus (semaglutide) Adlyxin (lixisenatide) Victoza (liraglutide) Byetta (exanatide) ___________________________________________________________________________  Due to recent changes in healthcare laws, you may see the results of your imaging and laboratory studies on MyChart before your provider has had a chance to review them.  We understand  that in some cases there may be results that are confusing or concerning to you. Not all laboratory results come back in the same time frame and the provider may be waiting for multiple results in order to interpret others.  Please give [place] 48 hours in order for your provider to thoroughly review all the results before contacting the office for clarification of your results.   It was a pleasure to see you today!  Vito Cirigliano, D.O.

## 2023-03-06 NOTE — Telephone Encounter (Signed)
Patient called and was not able to do her 04/09/23 SS appt. She has been r/s for 04/27/23 at 8 pm.  Mailed new packet and sent mychart.

## 2023-04-02 ENCOUNTER — Other Ambulatory Visit: Payer: Self-pay | Admitting: Gastroenterology

## 2023-04-02 DIAGNOSIS — K529 Noninfective gastroenteritis and colitis, unspecified: Secondary | ICD-10-CM

## 2023-04-08 ENCOUNTER — Encounter: Payer: Self-pay | Admitting: Certified Registered Nurse Anesthetist

## 2023-04-10 ENCOUNTER — Encounter: Payer: Self-pay | Admitting: Gastroenterology

## 2023-04-10 ENCOUNTER — Ambulatory Visit: Payer: Medicare Other | Admitting: Gastroenterology

## 2023-04-10 VITALS — BP 138/59 | HR 66 | Temp 97.5°F | Resp 19 | Ht 60.0 in | Wt 126.0 lb

## 2023-04-10 DIAGNOSIS — K219 Gastro-esophageal reflux disease without esophagitis: Secondary | ICD-10-CM | POA: Diagnosis not present

## 2023-04-10 DIAGNOSIS — R197 Diarrhea, unspecified: Secondary | ICD-10-CM | POA: Diagnosis not present

## 2023-04-10 DIAGNOSIS — R109 Unspecified abdominal pain: Secondary | ICD-10-CM | POA: Diagnosis not present

## 2023-04-10 DIAGNOSIS — K2970 Gastritis, unspecified, without bleeding: Secondary | ICD-10-CM

## 2023-04-10 DIAGNOSIS — D122 Benign neoplasm of ascending colon: Secondary | ICD-10-CM

## 2023-04-10 DIAGNOSIS — R1310 Dysphagia, unspecified: Secondary | ICD-10-CM

## 2023-04-10 DIAGNOSIS — R194 Change in bowel habit: Secondary | ICD-10-CM

## 2023-04-10 DIAGNOSIS — R159 Full incontinence of feces: Secondary | ICD-10-CM

## 2023-04-10 DIAGNOSIS — K2950 Unspecified chronic gastritis without bleeding: Secondary | ICD-10-CM | POA: Diagnosis not present

## 2023-04-10 DIAGNOSIS — D123 Benign neoplasm of transverse colon: Secondary | ICD-10-CM

## 2023-04-10 DIAGNOSIS — K580 Irritable bowel syndrome with diarrhea: Secondary | ICD-10-CM

## 2023-04-10 DIAGNOSIS — K635 Polyp of colon: Secondary | ICD-10-CM | POA: Diagnosis not present

## 2023-04-10 MED ORDER — SODIUM CHLORIDE 0.9 % IV SOLN: 500.0000 mL | INTRAVENOUS | Status: DC

## 2023-04-10 NOTE — Progress Notes (Signed)
Called to room to assist during endoscopic procedure.  Patient ID and intended procedure confirmed with present staff. Received instructions for my participation in the procedure from the performing physician.  

## 2023-04-10 NOTE — Progress Notes (Signed)
Report given to PACU, vss 

## 2023-04-10 NOTE — Op Note (Signed)
[facility] Patient Name: Mary Leblanc Procedure Date: 04/10/2023 2:09 PM MRN: [id number] Endoscopist: Doristine Locks , MD, [id number] Age: 82 Referring MD:  Date of Birth: [birth year] Gender: Female Account #: [id number] Procedure:                Upper GI endoscopy Indications:              Dysphagia, Esophageal reflux, Diarrhea/Change in                            stools Medicines:                Monitored Anesthesia Care Procedure:                Pre-Anesthesia Assessment:                           - Prior to the procedure, a History and Physical                            was performed, and patient medications and                            allergies were reviewed. The patient's tolerance of                            previous anesthesia was also reviewed. The risks                            and benefits of the procedure and the sedation                            options and risks were discussed with the patient.                            All questions were answered, and informed consent                            was obtained. Prior Anticoagulants: The patient has                            taken no anticoagulant or antiplatelet agents. ASA                            Grade Assessment: II - A patient with mild systemic                            disease. After reviewing the risks and benefits,                            the patient was deemed in satisfactory condition to                            undergo the procedure.  After obtaining informed consent, the endoscope was                            passed under direct vision. Throughout the                            procedure, the patient's blood pressure, pulse, and                            oxygen saturations were monitored continuously. The                            Olympus scope [id number] was introduced through the                            mouth, and advanced to the second part of  duodenum.                            The upper GI endoscopy was accomplished without                            difficulty. The patient tolerated the procedure                            well. Scope In: Scope Out: Findings:                 The examined esophagus was normal. A guidewire was                            placed and the scope was withdrawn. Dilation was                            performed with a Savary dilator with no resistance                            at 17 mm. The dilation site was examined following                            endoscope reinsertion and showed no bleeding,                            mucosal tear or perforation. Estimated blood loss:                            none.                           The Z-line was regular and was found 40 cm from the                            incisors.                           Localized mild inflammation characterized by  congestion (edema) and erythema was found in the                            gastric antrum. Biopsies were taken with a cold                            forceps for Helicobacter pylori testing. Estimated                            blood loss was minimal.                           The gastric fundus and gastric body were normal.                           The examined duodenum was normal. Biopsies were                            taken with a cold forceps for histology. Estimated                            blood loss was minimal. Complications:            No immediate complications. Estimated Blood Loss:     Estimated blood loss was minimal. Impression:               - Normal esophagus. Dilated with 17 mm Savary                            dilator.                           - Z-line regular, 40 cm from the incisors.                           - Mild, non-ulcer antral gastritis. Biopsied.                           - Normal gastric fundus and gastric body.                           - Normal  examined duodenum. Biopsied. Recommendation:           - Patient has a contact number available for                            emergencies. The signs and symptoms of potential                            delayed complications were discussed with the                            patient. Return to normal activities tomorrow.                            Written discharge instructions were provided  to the                            patient.                           - Resume previous diet.                           - Continue present medications.                           - Await pathology results.                           - Perform a colonoscopy today. Doristine Locks, MD 04/10/2023 3:02:15 PM

## 2023-04-10 NOTE — Progress Notes (Signed)
GASTROENTEROLOGY PROCEDURE H&P NOTE   Primary Care Physician: Gaspar Garbe, MD    Reason for Procedure:  Dysphagia, GERD, diarrhea, abdominal cramping, change in bowel habits  Plan:    EGD, colonoscopy  Patient is appropriate for endoscopic procedure(s) in the ambulatory ([facility]) setting.  The nature of the procedure, as well as the risks, benefits, and alternatives were carefully and thoroughly reviewed with the patient. Ample time for discussion and questions allowed. The patient understood, was satisfied, and agreed to proceed.     HPI: Mary Leblanc is a 82 y.o. female who presents for EGD and colonoscopy for evaluation of multiple GI symptoms, to include intermittent solid food dysphagia, history of GERD (well-controlled on current therapy), diarrhea, abdominal cramping, change in bowel habits.  Past Medical History:  Diagnosis Date   Anemia    Depression    Encounter for loop recorder check 02/14/2019   GERD (gastroesophageal reflux disease)    HTN (hypertension)    Hyperlipidemia    Osteopenia    Syncope and collapse 01/22/2019    Past Surgical History:  Procedure Laterality Date   ANAL RECTAL MANOMETRY N/A 08/15/2020   Procedure: ANO RECTAL MANOMETRY;  Surgeon: Shellia Cleverly, DO;  Location: [facility];  Service: Gastroenterology;  Laterality: N/A;   APPENDECTOMY  2000   BIOPSY  09/15/2020   Procedure: BIOPSY;  Surgeon: Lemar Lofty., MD;  Location: [facility] ENDOSCOPY;  Service: Gastroenterology;;   CERVICAL LAMINOPLASTY  2002   CHOLECYSTECTOMY  2013   ESOPHAGOGASTRODUODENOSCOPY (EGD) WITH PROPOFOL N/A 09/15/2020   Procedure: ESOPHAGOGASTRODUODENOSCOPY (EGD) WITH PROPOFOL;  Surgeon: Lemar Lofty., MD;  Location: [facility] ENDOSCOPY;  Service: Gastroenterology;  Laterality: N/A;   EUS N/A 09/15/2020   Procedure: UPPER ENDOSCOPIC ULTRASOUND (EUS) RADIAL;  Surgeon: Lemar Lofty., MD;  Location: [facility] ENDOSCOPY;  Service: Gastroenterology;   Laterality: N/A;    Prior to Admission medications   Medication Sig Start Date End Date Taking? Authorizing Provider  aspirin EC 81 MG tablet Take 81 mg by mouth daily. Swallow whole.   Yes [provider]  b complex vitamins capsule Take 1 capsule by mouth daily.   Yes [provider]  calcium carbonate (OS-CAL - DOSED IN MG OF ELEMENTAL CALCIUM) 1250 (500 Ca) MG tablet Take 1 tablet by mouth.   Yes [provider]  colestipol (COLESTID) 1 g tablet TAKE 1 TABLET BY MOUTH TWICE A DAY 04/02/23  Yes Wilferd Ritson V, DO  escitalopram (LEXAPRO) 20 MG tablet Take 20 mg by mouth daily. 05/05/21  Yes [provider]  omeprazole (PRILOSEC) 20 MG capsule Take 20 mg by mouth daily. 03/30/20  Yes [provider]  rosuvastatin (CRESTOR) 10 MG tablet TAKE 1 TABLET BY MOUTH EVERY DAY 02/28/23  Yes Yates Decamp, MD  valsartan (DIOVAN) 80 MG tablet Take 80 mg by mouth daily. 01/09/21  Yes [provider]  loperamide (IMODIUM) 2 MG capsule Take 4 mg by mouth as needed.    [provider]  Probiotic Product (PROBIOTIC PO) Take 1 capsule by mouth daily.    [provider]  tiZANidine (ZANAFLEX) 2 MG tablet Take 2-4 mg by mouth at bedtime as needed. 10/03/22   [provider]    Current Outpatient Medications  Medication Sig Dispense Refill   aspirin EC 81 MG tablet Take 81 mg by mouth daily. Swallow whole.     b complex vitamins capsule Take 1 capsule by mouth daily.     calcium carbonate (OS-CAL -  DOSED IN MG OF ELEMENTAL CALCIUM) 1250 (500 Ca) MG tablet Take 1 tablet by mouth.     colestipol (COLESTID) 1 g tablet TAKE 1 TABLET BY MOUTH TWICE A DAY 180 tablet 3   escitalopram (LEXAPRO) 20 MG tablet Take 20 mg by mouth daily.     omeprazole (PRILOSEC) 20 MG capsule Take 20 mg by mouth daily.     rosuvastatin (CRESTOR) 10 MG tablet TAKE 1 TABLET BY MOUTH EVERY DAY 90 tablet 1   valsartan (DIOVAN) 80 MG tablet Take 80 mg by mouth  daily.     loperamide (IMODIUM) 2 MG capsule Take 4 mg by mouth as needed.     Probiotic Product (PROBIOTIC PO) Take 1 capsule by mouth daily.     tiZANidine (ZANAFLEX) 2 MG tablet Take 2-4 mg by mouth at bedtime as needed.     Current Facility-Administered Medications  Medication Dose Route Frequency Provider Last Rate Last Admin   0.9 %  sodium chloride infusion  500 mL Intravenous Continuous Dakai Braithwaite V, DO        Allergies as of 04/10/2023   (Not on File)    Family History  Problem Relation Age of Onset   Hypertension Mother    Stroke Mother    Thyroid cancer Mother    Diabetes Mother    Heart attack Father    Prostate cancer Father    Kidney cancer Father    Clotting disorder Father    Diabetes Father    Non-Hodgkin's lymphoma Sister    Diabetes Brother    Heart disease Maternal Grandmother    Liver cancer Maternal Grandfather        not sure if it was liver or pancreatic cancer   Heart disease Paternal Grandmother    Heart Problems Paternal Grandfather 25   Colon cancer Neg Hx    Esophageal cancer Neg Hx    Pancreatic cancer Neg Hx    Sleep apnea Neg Hx     Social History   Socioeconomic History   Marital status: Married    Spouse name: Not on file   Number of children: 2   Years of education: Not on file   Highest education level: Not on file  Occupational History   Occupation: retired Charity fundraiser  Tobacco Use   Smoking status: Former    Current packs/day: 0.00    Average packs/day: 1 pack/day for 10.0 years (10.0 ttl pk-yrs)    Types: Cigarettes    Start date: 14    Quit date: 1968    Years since quitting: 56.9   Smokeless tobacco: Never  Vaping Use   Vaping status: Never Used  Substance and Sexual Activity   Alcohol use: Yes    Alcohol/week: 3.0 standard drinks of alcohol    Types: 3 Glasses of wine per week   Drug use: Not Currently   Sexual activity: Yes  Other Topics Concern   Not on file  Social History Narrative   Right handed   Lives  with husband   Social Determinants of Health   Financial Resource Strain: Not on file  Food Insecurity: Not on file  Transportation Needs: Not on file  Physical Activity: Not on file  Stress: Not on file  Social Connections: Not on file  Intimate Partner Violence: Not on file    Physical Exam: Vital signs in last 24 hours: @BP  119/70   Pulse 69   Temp (!) 97.5 F (36.4 C)   Ht 5' (1.524 m)  Wt 126 lb (57.2 kg)   SpO2 98%   BMI 24.61 kg/m  GEN: NAD EYE: Sclerae anicteric ENT: MMM CV: Non-tachycardic Pulm: CTA b/l GI: Soft, NT/ND NEURO:  Alert & Oriented x 3   Doristine Locks, DO [facility]   04/10/2023 2:07 PM

## 2023-04-10 NOTE — Progress Notes (Signed)
1412 Robinul 0.1 mg IV given due large amount of secretions upon assessment.  MD made aware, vss  

## 2023-04-10 NOTE — Progress Notes (Signed)
1448 Ephedrine 10 mg given IV due to low BP, MD updated.

## 2023-04-10 NOTE — Patient Instructions (Signed)
Resume previous diet and medications. Awaiting pathology results. Handout provided on colon polyps and Gastritis  YOU HAD AN ENDOSCOPIC PROCEDURE TODAY AT THE [facility]:   Refer to the procedure report that was given to you for any specific questions about what was found during the examination.  If the procedure report does not answer your questions, please call your gastroenterologist to clarify.  If you requested that your care partner not be given the details of your procedure findings, then the procedure report has been included in a sealed envelope for you to review at your convenience later.  YOU SHOULD EXPECT: Some feelings of bloating in the abdomen. Passage of more gas than usual.  Walking can help get rid of the air that was put into your GI tract during the procedure and reduce the bloating. If you had a lower endoscopy (such as a colonoscopy or flexible sigmoidoscopy) you may notice spotting of blood in your stool or on the toilet paper. If you underwent a bowel prep for your procedure, you may not have a normal bowel movement for a few days.  Please Note:  You might notice some irritation and congestion in your nose or some drainage.  This is from the oxygen used during your procedure.  There is no need for concern and it should clear up in a day or so.  SYMPTOMS TO REPORT IMMEDIATELY:  Following lower endoscopy (colonoscopy or flexible sigmoidoscopy):  Excessive amounts of blood in the stool  Significant tenderness or worsening of abdominal pains  Swelling of the abdomen that is new, acute  Fever of 100F or higher  Following upper endoscopy (EGD)  Vomiting of blood or coffee ground material  New chest pain or pain under the shoulder blades  Painful or persistently difficult swallowing  New shortness of breath  Fever of 100F or higher  Black, tarry-looking stools  For urgent or emergent issues, a gastroenterologist can be reached at any hour by calling [phone]  [phone]. Do not use MyChart messaging for urgent concerns.    DIET:  We do recommend a small meal at first, but then you may proceed to your regular diet.  Drink plenty of fluids but you should avoid alcoholic beverages for 24 hours.  ACTIVITY:  You should plan to take it easy for the rest of today and you should NOT DRIVE or use heavy machinery until tomorrow (because of the sedation medicines used during the test).    FOLLOW UP: Our staff will call the number listed on your records the next business day following your procedure.  We will call around 7:15- 8:00 am to check on you and address any questions or concerns that you may have regarding the information given to you following your procedure. If we do not reach you, we will leave a message.     If any biopsies were taken you will be contacted by phone or by letter within the next 1-3 weeks.  Please call us at [phone] if you have not heard about the biopsies in 3 weeks.    SIGNATURES/CONFIDENTIALITY: You and/or your care partner have signed paperwork which will be entered into your electronic medical record.  These signatures attest to the fact that that the information above on your After Visit Summary has been reviewed and is understood.  Full responsibility of the confidentiality of this discharge information lies with you and/or your care-partner.

## 2023-04-10 NOTE — Progress Notes (Signed)
1430 Ephedrine 10 mg given IV due to low BP, MD updated.    IV infiltrated with right hand 24g placed after 2 attempts, vss

## 2023-04-10 NOTE — Op Note (Signed)
[facility] Patient Name: Mary Leblanc Procedure Date: 04/10/2023 1:56 PM MRN: [id number] Endoscopist: Doristine Locks , MD, [id number] Age: 82 Referring MD:  Date of Birth: March 29, 1941 Gender: Female Account #: [id number] Procedure:                Colonoscopy Indications:              Change in bowel habits, Diarrhea, Abdominal cramping Medicines:                Monitored Anesthesia Care Procedure:                Pre-Anesthesia Assessment:                           - Prior to the procedure, a History and Physical                            was performed, and patient medications and                            allergies were reviewed. The patient's tolerance of                            previous anesthesia was also reviewed. The risks                            and benefits of the procedure and the sedation                            options and risks were discussed with the patient.                            All questions were answered, and informed consent                            was obtained. Prior Anticoagulants: The patient has                            taken no anticoagulant or antiplatelet agents. ASA                            Grade Assessment: II - A patient with mild systemic                            disease. After reviewing the risks and benefits,                            the patient was deemed in satisfactory condition to                            undergo the procedure.                           After obtaining informed consent, the colonoscope  was passed under direct vision. Throughout the                            procedure, the patient's blood pressure, pulse, and                            oxygen saturations were monitored continuously. The                            Olympus Scope [id number] was introduced through the                            anus and advanced to the the cecum, identified by                             appendiceal orifice and ileocecal valve. The                            colonoscopy was performed without difficulty. The                            patient tolerated the procedure well. The quality                            of the bowel preparation was good. The ileocecal                            valve, appendiceal orifice, and rectum were                            photographed. Scope In: 2:27:50 PM Scope Out: 2:58:01 PM Scope Withdrawal Time: 0 hours 25 minutes 15 seconds  Total Procedure Duration: 0 hours 30 minutes 11 seconds  Findings:                 The perianal and digital rectal examinations were                            normal.                           Six mucous-capped and semi-sessile polyps were                            found in the transverse colon and ascending colon.                            The polyps were 2 to 8 mm in size. These polyps                            were removed with a cold snare. Resection and                            retrieval were complete. Estimated blood loss was  minimal.                           The mucosa was otherwise normal throughout the                            colon. Biopsies for histology were taken with a                            cold forceps from the right colon and left colon                            for evaluation of microscopic colitis. Estimated                            blood loss was minimal.                           The retroflexed view of the distal rectum and anal                            verge was normal and showed no anal or rectal                            abnormalities. Complications:            No immediate complications. Estimated Blood Loss:     Estimated blood loss was minimal. Impression:               - Six 2 to 8 mm polyps in the transverse colon and                            in the ascending colon, removed with a cold snare.                            Resected and  retrieved.                           - Normal mucosa in the entire examined colon.                            Biopsied.                           - The distal rectum and anal verge are normal on                            retroflexion view. Recommendation:           - Patient has a contact number available for                            emergencies. The signs and symptoms of potential                            delayed complications were discussed  with the                            patient. Return to normal activities tomorrow.                            Written discharge instructions were provided to the                            patient.                           - Resume previous diet.                           - Continue present medications.                           - Await pathology results.                           - Repeat colonoscopy for surveillance based on                            pathology results.                           - Return to GI office PRN. Doristine Locks, MD 04/10/2023 3:07:22 PM

## 2023-04-11 ENCOUNTER — Telehealth: Payer: Self-pay

## 2023-04-11 NOTE — Telephone Encounter (Signed)
  Follow up Call-     04/10/2023    1:42 PM  Call back number  Post procedure Call Back phone  # [phone]  Permission to leave phone message Yes     Patient questions:  Do you have a fever, pain , or abdominal swelling? No. Pain Score  0 *  Have you tolerated food without any problems? Yes.    Have you been able to return to your normal activities? Yes.    Do you have any questions about your discharge instructions: Diet   No. Medications  No. Follow up visit  No.  Do you have questions or concerns about your Care? No.  Actions: * If pain score is 4 or above: No action needed, pain <4.

## 2023-04-15 LAB — SURGICAL PATHOLOGY

## 2023-04-27 ENCOUNTER — Ambulatory Visit (INDEPENDENT_AMBULATORY_CARE_PROVIDER_SITE_OTHER): Payer: Medicare Other | Admitting: Neurology

## 2023-04-27 DIAGNOSIS — R351 Nocturia: Secondary | ICD-10-CM

## 2023-04-27 DIAGNOSIS — R6889 Other general symptoms and signs: Secondary | ICD-10-CM

## 2023-04-27 DIAGNOSIS — R519 Headache, unspecified: Secondary | ICD-10-CM

## 2023-04-27 DIAGNOSIS — Z9189 Other specified personal risk factors, not elsewhere classified: Secondary | ICD-10-CM

## 2023-04-27 DIAGNOSIS — R001 Bradycardia, unspecified: Secondary | ICD-10-CM

## 2023-04-27 DIAGNOSIS — G478 Other sleep disorders: Secondary | ICD-10-CM

## 2023-04-27 DIAGNOSIS — R0683 Snoring: Secondary | ICD-10-CM

## 2023-04-27 DIAGNOSIS — G4752 REM sleep behavior disorder: Secondary | ICD-10-CM

## 2023-04-27 DIAGNOSIS — G4720 Circadian rhythm sleep disorder, unspecified type: Secondary | ICD-10-CM

## 2023-04-27 DIAGNOSIS — G4719 Other hypersomnia: Secondary | ICD-10-CM

## 2023-07-11 ENCOUNTER — Other Ambulatory Visit: Payer: Self-pay

## 2023-07-11 DIAGNOSIS — K862 Cyst of pancreas: Secondary | ICD-10-CM

## 2023-07-11 DIAGNOSIS — R933 Abnormal findings on diagnostic imaging of other parts of digestive tract: Secondary | ICD-10-CM

## 2023-07-16 ENCOUNTER — Other Ambulatory Visit: Payer: Self-pay | Admitting: Physician Assistant

## 2023-07-16 ENCOUNTER — Ambulatory Visit (HOSPITAL_COMMUNITY)
Admission: RE | Admit: 2023-07-16 | Discharge: 2023-07-16 | Disposition: A | Discharge status: Home / Self Care | Source: Ambulatory Visit | Attending: Physician Assistant | Admitting: Physician Assistant

## 2023-07-16 DIAGNOSIS — K862 Cyst of pancreas: Secondary | ICD-10-CM | POA: Diagnosis present

## 2023-07-16 DIAGNOSIS — R933 Abnormal findings on diagnostic imaging of other parts of digestive tract: Secondary | ICD-10-CM | POA: Insufficient documentation

## 2023-07-16 MED ORDER — GADOBUTROL 1 MMOL/ML IV SOLN
5.0000 mL | Freq: Once | INTRAVENOUS | Status: AC | PRN
  Administered 2023-07-16: 5 mL via INTRAVENOUS

## 2023-08-05 ENCOUNTER — Encounter: Payer: Self-pay | Admitting: Gastroenterology

## 2023-08-06 NOTE — Telephone Encounter (Signed)
 2 year MRCP reminder in epic. Patient reviewed MyChart message from [place] with results.

## 2023-10-31 ENCOUNTER — Ambulatory Visit: Payer: Self-pay | Admitting: Cardiology

## 2023-12-26 ENCOUNTER — Emergency Department (HOSPITAL_BASED_OUTPATIENT_CLINIC_OR_DEPARTMENT_OTHER)

## 2023-12-26 ENCOUNTER — Encounter (HOSPITAL_BASED_OUTPATIENT_CLINIC_OR_DEPARTMENT_OTHER): Payer: Self-pay | Admitting: Emergency Medicine

## 2023-12-26 ENCOUNTER — Other Ambulatory Visit: Payer: Self-pay

## 2023-12-26 ENCOUNTER — Emergency Department (HOSPITAL_BASED_OUTPATIENT_CLINIC_OR_DEPARTMENT_OTHER)
Admission: EM | Admit: 2023-12-26 | Discharge: 2023-12-26 | Disposition: A | Discharge status: Home / Self Care | Source: Ambulatory Visit | Attending: Emergency Medicine | Admitting: Emergency Medicine

## 2023-12-26 DIAGNOSIS — I10 Essential (primary) hypertension: Secondary | ICD-10-CM | POA: Insufficient documentation

## 2023-12-26 DIAGNOSIS — I639 Cerebral infarction, unspecified: Secondary | ICD-10-CM

## 2023-12-26 DIAGNOSIS — R9082 White matter disease, unspecified: Secondary | ICD-10-CM | POA: Insufficient documentation

## 2023-12-26 DIAGNOSIS — H538 Other visual disturbances: Secondary | ICD-10-CM | POA: Insufficient documentation

## 2023-12-26 DIAGNOSIS — Z7982 Long term (current) use of aspirin: Secondary | ICD-10-CM | POA: Insufficient documentation

## 2023-12-26 DIAGNOSIS — Z87891 Personal history of nicotine dependence: Secondary | ICD-10-CM | POA: Diagnosis not present

## 2023-12-26 DIAGNOSIS — Z79899 Other long term (current) drug therapy: Secondary | ICD-10-CM | POA: Diagnosis not present

## 2023-12-26 LAB — COMPREHENSIVE METABOLIC PANEL WITH GFR
ALT: 14 U/L (ref 0–44)
AST: 28 U/L (ref 15–41)
Albumin: 4.5 g/dL (ref 3.5–5.0)
Alkaline Phosphatase: 41 U/L (ref 38–126)
Anion gap: 10 (ref 5–15)
BUN: 19 mg/dL (ref 8–23)
CO2: 24 mmol/L (ref 22–32)
Calcium: 9.7 mg/dL (ref 8.9–10.3)
Chloride: 106 mmol/L (ref 98–111)
Creatinine, Ser: 1.1 mg/dL — ABNORMAL HIGH (ref 0.44–1.00)
GFR, Estimated: 50 mL/min — ABNORMAL LOW (ref >60)
Glucose, Bld: 82 mg/dL (ref 70–99)
Potassium: 4 mmol/L (ref 3.5–5.1)
Sodium: 140 mmol/L (ref 135–145)
Total Bilirubin: 0.8 mg/dL (ref 0.0–1.2)
Total Protein: 6.8 g/dL (ref 6.5–8.1)

## 2023-12-26 LAB — CBC
HCT: 32.8 % — ABNORMAL LOW (ref 36.0–46.0)
Hemoglobin: 10.8 g/dL — ABNORMAL LOW (ref 12.0–15.0)
MCH: 32.8 pg (ref 26.0–34.0)
MCHC: 32.9 g/dL (ref 30.0–36.0)
MCV: 99.7 fL (ref 80.0–100.0)
Platelets: 160 K/uL (ref 150–400)
RBC: 3.29 MIL/uL — ABNORMAL LOW (ref 3.87–5.11)
RDW: 13.4 % (ref 11.5–15.5)
WBC: 3.9 K/uL — ABNORMAL LOW (ref 4.0–10.5)
nRBC: 0 % (ref 0.0–0.2)

## 2023-12-26 LAB — URINE DRUG SCREEN
Amphetamines: NOT DETECTED
Barbiturates: NOT DETECTED
Benzodiazepines: NOT DETECTED
Cocaine: NOT DETECTED
Fentanyl: NOT DETECTED
Methadone Scn, Ur: NOT DETECTED
Opiates: NOT DETECTED
Tetrahydrocannabinol: NOT DETECTED

## 2023-12-26 LAB — DIFFERENTIAL
Abs Immature Granulocytes: 0.01 K/uL (ref 0.00–0.07)
Basophils Absolute: 0 K/uL (ref 0.0–0.1)
Basophils Relative: 0 %
Eosinophils Absolute: 0.1 K/uL (ref 0.0–0.5)
Eosinophils Relative: 2 %
Immature Granulocytes: 0 %
Lymphocytes Relative: 41 %
Lymphs Abs: 1.6 K/uL (ref 0.7–4.0)
Monocytes Absolute: 0.3 K/uL (ref 0.1–1.0)
Monocytes Relative: 7 %
Neutro Abs: 1.9 K/uL (ref 1.7–7.7)
Neutrophils Relative %: 50 %

## 2023-12-26 LAB — APTT: aPTT: 32 s (ref 24–36)

## 2023-12-26 LAB — ETHANOL: Alcohol, Ethyl (B): <15 mg/dL (ref <15)

## 2023-12-26 LAB — PROTIME-INR
INR: 1 (ref 0.8–1.2)
Prothrombin Time: 13.7 s (ref 11.4–15.2)

## 2023-12-26 NOTE — Discharge Instructions (Signed)
 MRI without any evidence of stroke.  Blood pressure has been slightly elevated here but has improved some.  Recommend keeping a daily log of your blood pressure make an appointment to follow back up with your primary care doctor next week sometime for recheck of blood pressure.  Return for any new or worse symptoms.

## 2023-12-26 NOTE — ED Provider Notes (Addendum)
 [place] EMERGENCY DEPARTMENT AT [facility] Provider Note   CSN: [id number] Arrival date & time: 12/26/23  1516     Patient presents with: Hypertension   Mary Leblanc is a 83 y.o. female.   Patient sent in by primary care provider for onset at 11:00's morning sort of bilateral blurred vision.  And feeling like left upper extremity is not normal.  On exam it does drift a little bit.  She had no trouble with walking gait seem to be fine.  No headache.  Does have a pre-existing history of hypertension did notice that her blood pressure was high at home today here is 156/95.  Patient is never had a stroke in the past.  Past medical history sniffer hyperlipidemia hypertension gastroesophageal reflux disease surgical history sniffer appendectomy cholecystectomy.  Patient is a former smoker quit 1960.  Patient is not on any blood thinners.  Other than a baby aspirin a day       Prior to Admission medications   Medication Sig Start Date End Date Taking? Authorizing Provider  aspirin EC 81 MG tablet Take 81 mg by mouth daily. Swallow whole.    [provider]  b complex vitamins capsule Take 1 capsule by mouth daily.    [provider]  calcium  carbonate (OS-CAL - DOSED IN MG OF ELEMENTAL CALCIUM ) 1250 (500 Ca) MG tablet Take 1 tablet by mouth.    [provider]  colestipol  (COLESTID ) 1 g tablet TAKE 1 TABLET BY MOUTH TWICE A DAY 04/02/23   Cirigliano, Vito V, DO  escitalopram (LEXAPRO) 20 MG tablet Take 20 mg by mouth daily. 05/05/21   [provider]  loperamide (IMODIUM) 2 MG capsule Take 4 mg by mouth as needed.    [provider]  omeprazole (PRILOSEC) 20 MG capsule Take 20 mg by mouth daily. 03/30/20   [provider]  Probiotic Product (PROBIOTIC PO) Take 1 capsule by mouth daily.    [provider]  rosuvastatin  (CRESTOR ) 10 MG tablet TAKE 1 TABLET BY MOUTH EVERY DAY 02/28/23   Ladona Heinz, MD  tiZANidine  (ZANAFLEX) 2 MG tablet Take 2-4 mg by mouth at bedtime as needed. 10/03/22   [provider]  valsartan  (DIOVAN ) 80 MG tablet Take 80 mg by mouth daily. 01/09/21   [provider]    Allergies: Patient has no allergy information on record.    Review of Systems  Constitutional:  Negative for chills and fever.  HENT:  Negative for ear pain and sore throat.   Eyes:  Positive for visual disturbance. Negative for pain.  Respiratory:  Negative for cough and shortness of breath.   Cardiovascular:  Negative for chest pain and palpitations.  Gastrointestinal:  Negative for abdominal pain and vomiting.  Genitourinary:  Negative for dysuria and hematuria.  Musculoskeletal:  Negative for arthralgias and back pain.  Skin:  Negative for color change and rash.  Neurological:  Positive for weakness. Negative for seizures and syncope.  All other systems reviewed and are negative.   Updated Vital Signs BP (!) 156/95 (BP Location: Right Arm)   Pulse (!) 56   Temp 98.3 F (36.8 C)   Resp 18   SpO2 97%   Physical Exam Vitals and nursing note reviewed.  Constitutional:      General: She is not in acute distress.    Appearance: Normal appearance. She is well-developed.  HENT:     Head: Normocephalic and atraumatic.     Mouth/Throat:  Mouth: Mucous membranes are moist.  Eyes:     Conjunctiva/sclera: Conjunctivae normal.  Cardiovascular:     Rate and Rhythm: Normal rate and regular rhythm.     Heart sounds: No murmur heard. Pulmonary:     Effort: Pulmonary effort is normal. No respiratory distress.     Breath sounds: Normal breath sounds.  Abdominal:     Palpations: Abdomen is soft.     Tenderness: There is no abdominal tenderness.  Musculoskeletal:        General: No swelling.     Cervical back: Neck supple.  Skin:    General: Skin is warm and dry.     Capillary Refill: Capillary refill takes less than 2 seconds.  Neurological:     Mental Status: She is alert and  oriented to person, place, and time.     Cranial Nerves: No cranial nerve deficit.     Sensory: No sensory deficit.     Motor: Weakness present.     Gait: Gait normal.     Comments: Patient with left upper extremity drift.  Psychiatric:        Mood and Affect: Mood normal.     (all labs ordered are listed, but only abnormal results are displayed) Labs Reviewed  ETHANOL  PROTIME-INR  APTT  CBC  DIFFERENTIAL  COMPREHENSIVE METABOLIC PANEL WITH GFR  URINE DRUG SCREEN    EKG: EKG Interpretation Date/Time:  Thursday December 26 2023 16:11:04 EDT Ventricular Rate:  51 PR Interval:  154 QRS Duration:  82 QT Interval:  538 QTC Calculation: 495 R Axis:   36  Text Interpretation: Sinus bradycardia Prolonged QT Abnormal ECG When compared with ECG of 01-Sep-2021 18:06, PREVIOUS ECG IS PRESENT No significant change since last tracing Confirmed by Lenwood Balsam [phone]) on 12/26/2023 4:43:31 PM  Radiology: No results found.   Procedures   Medications Ordered in the ED - No data to display                                  Medical Decision Making Amount and/or Complexity of Data Reviewed Labs: ordered. Radiology: ordered.   Patient not a candidate for TNK.  1 based on time and based on minimal symptoms.  But patient does seem to have an abnormality of the left upper extremity.  There was bilateral blurred vision.  That has resolved.  Since the MRI is here and they leave at 1900 we will go ahead and just go straight to MRI.  No headache no real concerns for head bleed based on her presentation.  This possibly could be a small CVA.  CRITICAL CARE Performed by: Tyshea Imel Total critical care time: 40 minutes Critical care time was exclusive of separately billable procedures and treating other patients. Critical care was necessary to treat or prevent imminent or life-threatening deterioration. Critical care was time spent personally by me on the following activities:  development of treatment plan with patient and/or surrogate as well as nursing, discussions with consultants, evaluation of patient's response to treatment, examination of patient, obtaining history from patient or surrogate, ordering and performing treatments and interventions, ordering and review of laboratory studies, ordering and review of radiographic studies, pulse oximetry and re-evaluation of patient's condition.  MRI brain negative for any acute abnormalities.  Patient's blood pressure has been elevated here today she has blood pressure medicine at home she can follow-up with her doctors to make sure and trend  that to make sure it gets under better control.  Final diagnoses:  Cerebrovascular accident (CVA), unspecified mechanism [facility])    ED Discharge Orders     None          Geraldene Hamilton, MD 12/26/23 1700    Geraldene Hamilton, MD 12/26/23 [id number]    Geraldene Hamilton, MD 12/26/23 [id number]

## 2023-12-26 NOTE — ED Triage Notes (Signed)
 Pt c/o blurred vision, L arm feels weird and hypertension at home that started this morning. Notified PCP and sent here for further evaluation. BP taken last hs.

## 2024-01-09 ENCOUNTER — Other Ambulatory Visit: Payer: Self-pay | Admitting: Cardiology

## 2024-01-09 DIAGNOSIS — E782 Mixed hyperlipidemia: Secondary | ICD-10-CM

## 2024-01-30 NOTE — Progress Notes (Unsigned)
 01/31/2024 MAHA FISCHEL [id number] [birth year]  Referring provider: Verta Izetta HERO, NP Primary GI doctor: Dr. 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 Pylori MRI brain normal CXR 2023 COPD - check labs, check anemia labs -consider repeat CXR/CT chest - consider changing SSRI to SNRI with PCP may help fatigue as well as AB viseral hypersenitivity  Normocytic anemia 12/26/2023  HGB 10.8 MCV 99.7 Platelets 160 Recent Labs    12/26/23 1708  HGB 10.8*  -Check B12, iron, ferritin, folate, check retic count  GERD with dysphagia 04/10/2023 EGD Dr. San normal esophagus status post dilation 17 mm mild nonulcer antral gastritis normal duodenum, negative celiac negative H. pylori, mild chronic inactive gastritis negative metaplasia -No melena, well controlled at this time -Continue omeprazole 20 mg daily  IBS-D with likely pelvic floor dysfunction 12//2024 colonoscopy negative microscopic colitis 02/03/2021 pancreatic elastase normal, fecal cal protectin normal, celiac negative Has had improvement with colestipol  1 gram twice a day, she can have a BM every other day, diarrhea dependent on what she eat worse with veggies, can have fecal incontinence if it is tool lose Some associated lower AB pain, urgency with food No hematochezia, no melena - continue colestipol  1 gram twice a day - consider switching SSRI to SNRI - check CBC, CMET, ESR, TSH - consider imaging pending results  Pancreatic cyst and mildly dilated pancreatic duct 07/16/2023 MRCP unchanged 8 mm cystic focus pancreatic tail  direct medication to the main pancreatic duct 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 female with a past medical history of hypertension, hyperlipidemia, exertional chest pain, remote history of DVT, bradycardia status post loop recorder 12/2017, appendectomy, cholecystectomy 2013 and neck surgery, IBS-D, bowel incontinence and others listed below presents for evaluation of AB pain, fatigue.   Last seen in the office 03/05/2023 by Dr. San for dysphagia abdominal discomfort IBS-D and nausea.  Discussed the use of AI scribe software for clinical note transcription with the patient, who gave verbal consent to proceed.  History of Present Illness   MARCELLA CHARLSON is an 83 year old female who presents with ongoing diarrhea and abdominal discomfort.  She experiences ongoing gastrointestinal symptoms, including diarrhea and abdominal discomfort described as 'tender'. She has bouts of diarrhea, referred to as 'dumping', and in the past, she experienced fainting episodes that seemed to be related, but she no longer faints. She takes colestipol , one tablet twice daily, which has significantly helped manage her symptoms. However, she still experiences diarrhea a couple of times a day, particularly after consuming certain foods like kale, which she identifies as a trigger. She tries to eat  healthily but is cautious with vegetables to avoid episodes.  She has bowel  movements every other day, and she reports that certain foods, such as kale, can trigger diarrhea. She sometimes has formed stools, which she prefers due to her lack of sphincter control. Occasional bloating and urgency occur, often in response to eating. No blood in her stool is reported, but there was a past instance of dark stool after consuming kale.  She reports a lack of energy and mentions a history of weight loss, although she prefers not to gain weight due to her small frame. She is currently on Lexapro for depression and anxiety, which she has been taking for a long time. She also takes Prilosec at night and uses Mobic as needed, though she avoids it due to potential interactions with Lexapro.  She denies any recent changes in medication or supplement use, following advice to avoid supplements. She consumes yogurt without issues. Her spouse notes she spends a significant amount of time in bed, approximately 10-12 hours, though she does not sleep the entire time. She wakes up during the night to use the bathroom but can fall back asleep. She has had a negative sleep study for sleep apnea.  She has a history of pelvic floor issues following a tear during childbirth, affecting her muscle tone and contributing to urinary and stool incontinence. She has tried pelvic floor exercises in the past without significant improvement. No recent tick exposure or travel that could contribute to her symptoms.      She  reports that she quit smoking about 57 years ago. Her smoking use included cigarettes. She started smoking about 67 years ago. She has a 10 pack-year smoking history. She has never used smokeless tobacco. She reports current alcohol use of about 3.0 standard drinks of alcohol per week. She reports that she does not currently use drugs.  RELEVANT GI HISTORY, IMAGING AND LABS: Results   LABS Hemoglobin: low (12/26/2023)  RADIOLOGY MRCP abdomen: Pancreatic cyst, mildly dilated bile ducts,  normal liver function, normal bowel appearance, normal abdominal findings (07/2023)     IBS-D.  Episodic lower abdominal pain with nonbloody diarrhea, which can be explosive at times.  Worries about leaving the house for fear of episode. - Has completed 6 or 7 colonoscopies in her lifetime, with a single polyp removed during 1 of those colonoscopies. - 10/15/2017: Colonoscopy at [facility] clinic in [place] : External hemorrhoids, no polyps.  No bxs. Repeat in 5 years - 07/06/2020: CT abdomen/pelvis: Normal GI tract.  PD diffusely dilated to the ampulla measuring 6 mm. Ccy with postoperative biliary dilation - 07/24/2020: MRI/MRCP: Very mild dilatation of main PD and CBD and intrahepatic biliary tree.  No pancreatic head mass, no definite ampullary lesion.  Mild hepatic steatosis - 08/15/2020: ARM: Weak anal sphincter pressures, some evidence of pelvic floor dyssynergia and hyposensitivity.  Referred to [facility] and PT - 09/15/2020: EUS: Hyperechoic stranding in entire pancreas, PD dilated in the pancreatic head, genu, body, and tail with proximal tapering.  Prominent PD sidebranches in body/tail with tortuous/ectatic appearance in genu transition to body.  Dilation of CBD and common hepatic duct.  No ampullary mass.  No malignant appearing lymph nodes.  Recommended repeat MRI/MRCP in 1 year - 02/01/2021: GI follow-up.  Ongoing episodic diarrhea with lower abdominal pain.  Trialed course of Florastor, IBgard.  Discussed changing PPI to H2 blocker. Ordered SIBO breath testing (never completed) - 02/02/2021: Follow-up in PT Clinic. Sxs improved (partially met goals). D/c from PT -  02/03/2021: Fecal calprotectin 111 (borderline), normal pancreatic elastase, TSH, BMP, TTG, IgA.  Calcium  10.6 - 06/08/2021: Follow-up in GI clinic.  Continued intermittent diarrhea, cramping, bloating.  Started rifaximin  and probiotics with continued low FODMAP diet.  Discussed referral to Colorectal Surgery for week anal sphincter pressure. -  06/22/2021: Follow-up in GI clinic.  Continued fecal incontinence with urgency, bloating, increased flatus.  Started colestipol .  Again discussed potentially switching SSRI to TCA, which would be done in concert with PCM.  Ordered repeat MRCP.  Negative/normal GI PCR panel, C. difficile, CBC.  ALT 65, otherwise normal CMP.  Normal CRP, acute viral hep panel. - 07/06/2021: MRI abdomen: Normal liver, CBD.  Stable mild dilatation of PD measuring 4 mm in HOP without obstructing mass.  9 mm exophytic cyst in tail of pancreas which appears to communicate with PD.  Most likely sidebranch IPMN.  Recommend pre-/postcontrast MRI/MRCP in 2 years. - 08/23/2021: Follow-up in the GI clinic.  Improvement in diarrhea with colestipol  and low FODMAP diet, now with formed stools. CBC    Component Value Date/Time   WBC 3.9 (L) 12/26/2023 1708   RBC 3.29 (L) 12/26/2023 1708   HGB 10.8 (L) 12/26/2023 1708   HCT 32.8 (L) 12/26/2023 1708   PLT 160 12/26/2023 1708   MCV 99.7 12/26/2023 1708   MCH 32.8 12/26/2023 1708   MCHC 32.9 12/26/2023 1708   RDW 13.4 12/26/2023 1708   LYMPHSABS 1.6 12/26/2023 1708   MONOABS 0.3 12/26/2023 1708   EOSABS 0.1 12/26/2023 1708   BASOSABS 0.0 12/26/2023 1708   Recent Labs    12/26/23 1708  HGB 10.8*    CMP     Component Value Date/Time   NA 140 12/26/2023 1708   NA 139 12/16/2020 0000   K 4.0 12/26/2023 1708   CL 106 12/26/2023 1708   CO2 24 12/26/2023 1708   GLUCOSE 82 12/26/2023 1708   BUN 19 12/26/2023 1708   BUN 13 12/16/2020 0000   CREATININE 1.10 (H) 12/26/2023 1708   CALCIUM  9.7 12/26/2023 1708   PROT 6.8 12/26/2023 1708   ALBUMIN 4.5 12/26/2023 1708   AST 28 12/26/2023 1708   ALT 14 12/26/2023 1708   ALKPHOS 41 12/26/2023 1708   BILITOT 0.8 12/26/2023 1708   GFRNONAA 50 (L) 12/26/2023 1708   GFRAA 58.0 12/16/2020 0000      Latest Ref Rng & Units 12/26/2023    5:08 PM 09/01/2021    6:20 PM 06/22/2021    2:19 PM  Hepatic Function  Total Protein 6.5 - 8.1  g/dL 6.8  7.6  7.9   Albumin 3.5 - 5.0 g/dL 4.5  4.7  4.9   AST 15 - 41 U/L 28  25  32   ALT 0 - 44 U/L 14  20  65   Alk Phosphatase 38 - 126 U/L 41  38  40   Total Bilirubin 0.0 - 1.2 mg/dL 0.8  0.8  0.7       Current Medications:   Current Outpatient Medications (Endocrine & Metabolic):    levothyroxine (SYNTHROID) 25 MCG tablet, 1 tablet in the morning on an empty stomach Orally Once a day; Duration: 90 days  Current Outpatient Medications (Cardiovascular):    colestipol  (COLESTID ) 1 g tablet, TAKE 1 TABLET BY MOUTH TWICE A DAY   rosuvastatin  (CRESTOR ) 10 MG tablet, TAKE 1 TABLET BY MOUTH EVERY DAY   valsartan  (DIOVAN ) 80 MG tablet, Take 80 mg by mouth daily.   Current Outpatient Medications (Analgesics):  aspirin EC 81 MG tablet, Take 81 mg by mouth daily. Swallow whole.   meloxicam (MOBIC) 15 MG tablet, as needed. (Patient not taking: Reported on 01/31/2024)   Current Outpatient Medications (Other):    b complex vitamins capsule, Take 1 capsule by mouth daily.   calcium  carbonate (OS-CAL - DOSED IN MG OF ELEMENTAL CALCIUM ) 1250 (500 Ca) MG tablet, Take 1 tablet by mouth.   escitalopram (LEXAPRO) 20 MG tablet, Take 20 mg by mouth daily.   loperamide (IMODIUM) 2 MG capsule, Take 4 mg by mouth as needed.   omeprazole (PRILOSEC) 20 MG capsule, Take 20 mg by mouth daily.   Probiotic Product (PROBIOTIC PO), Take 1 capsule by mouth daily.   tiZANidine (ZANAFLEX) 2 MG tablet, Take 2-4 mg by mouth at bedtime as needed.  Medical History:  Past Medical History:  Diagnosis Date   Anemia    Depression    Encounter for loop recorder check 02/14/2019   GERD (gastroesophageal reflux disease)    HTN (hypertension)    Hyperlipidemia    Osteopenia    Syncope and collapse 01/22/2019   Allergies: Not on File   Surgical History:  She  has a past surgical history that includes Appendectomy (2000); Cholecystectomy (2013); Cervical laminoplasty (2002); Anal Rectal manometry (N/A,  08/15/2020); EUS (N/A, 09/15/2020); biopsy (09/15/2020); and Esophagogastroduodenoscopy (egd) with propofol  (N/A, 09/15/2020). Family History:  Her family history includes Clotting disorder in her father; Diabetes in her brother, father, and mother; Heart Problems (age of onset: 15) in her paternal grandfather; Heart attack in her father; Heart disease in her maternal grandmother and paternal grandmother; Hypertension in her mother; Kidney cancer in her father; Liver cancer in her maternal grandfather; Non-Hodgkin's lymphoma in her sister; Prostate cancer in her father; Stroke in her mother; Thyroid  cancer in her mother.  REVIEW OF SYSTEMS  : All other systems reviewed and negative except where noted in the History of Present Illness.  PHYSICAL EXAM: BP 118/60   Pulse 78   Ht 5' (1.524 m)   Wt 116 lb 6 oz (52.8 kg)   BMI 22.73 kg/m  Physical Exam   GENERAL APPEARANCE: Well nourished, in no apparent distress. HEENT: No cervical lymphadenopathy, unremarkable thyroid , sclerae anicteric, conjunctiva pink. RESPIRATORY: Respiratory effort normal, breath sounds equal bilaterally without rales, rhonchi, or wheezing. CARDIO: Regular rate and rhythm with no murmurs, rubs, or gallops, peripheral pulses intact. ABDOMEN: Soft, non-distended, active bowel sounds in all four quadrants, no tenderness to palpation, no rebound, no mass appreciated. RECTAL: Declines. MUSCULOSKELETAL: Full range of motion, normal gait, without edema. SKIN: Dry, intact without rashes or lesions. No jaundice. NEURO: Alert, oriented, no focal deficits. PSYCH: Cooperative, normal mood and affect.      Alan JONELLE Coombs, PA-C 11:47 AM

## 2024-01-31 ENCOUNTER — Other Ambulatory Visit (INDEPENDENT_AMBULATORY_CARE_PROVIDER_SITE_OTHER)

## 2024-01-31 ENCOUNTER — Ambulatory Visit (INDEPENDENT_AMBULATORY_CARE_PROVIDER_SITE_OTHER): Admitting: Physician Assistant

## 2024-01-31 ENCOUNTER — Encounter: Payer: Self-pay | Admitting: Physician Assistant

## 2024-01-31 ENCOUNTER — Other Ambulatory Visit

## 2024-01-31 VITALS — BP 118/60 | HR 78 | Ht 60.0 in | Wt 116.4 lb

## 2024-01-31 DIAGNOSIS — K2970 Gastritis, unspecified, without bleeding: Secondary | ICD-10-CM

## 2024-01-31 DIAGNOSIS — K580 Irritable bowel syndrome with diarrhea: Secondary | ICD-10-CM | POA: Diagnosis not present

## 2024-01-31 DIAGNOSIS — I10 Essential (primary) hypertension: Secondary | ICD-10-CM

## 2024-01-31 DIAGNOSIS — R5383 Other fatigue: Secondary | ICD-10-CM

## 2024-01-31 DIAGNOSIS — R109 Unspecified abdominal pain: Secondary | ICD-10-CM

## 2024-01-31 DIAGNOSIS — E871 Hypo-osmolality and hyponatremia: Secondary | ICD-10-CM | POA: Diagnosis not present

## 2024-01-31 DIAGNOSIS — K862 Cyst of pancreas: Secondary | ICD-10-CM | POA: Diagnosis not present

## 2024-01-31 DIAGNOSIS — K219 Gastro-esophageal reflux disease without esophagitis: Secondary | ICD-10-CM

## 2024-01-31 DIAGNOSIS — R55 Syncope and collapse: Secondary | ICD-10-CM

## 2024-01-31 DIAGNOSIS — Z860100 Personal history of colon polyps, unspecified: Secondary | ICD-10-CM

## 2024-01-31 DIAGNOSIS — R159 Full incontinence of feces: Secondary | ICD-10-CM

## 2024-01-31 DIAGNOSIS — R1310 Dysphagia, unspecified: Secondary | ICD-10-CM | POA: Diagnosis not present

## 2024-01-31 DIAGNOSIS — D122 Benign neoplasm of ascending colon: Secondary | ICD-10-CM

## 2024-01-31 DIAGNOSIS — K529 Noninfective gastroenteritis and colitis, unspecified: Secondary | ICD-10-CM

## 2024-01-31 DIAGNOSIS — D649 Anemia, unspecified: Secondary | ICD-10-CM

## 2024-01-31 NOTE — Patient Instructions (Addendum)
 _______________________________________________________  If your blood pressure at your visit was 140/90 or greater, please contact your primary care physician to follow up on this.  _______________________________________________________  If you are age 82 or older, your body mass index should be between 23-30. Your Body mass index is 22.73 kg/m. If this is out of the aforementioned range listed, please consider follow up with your Primary Care Provider.  If you are age 60 or younger, your body mass index should be between 19-25. Your Body mass index is 22.73 kg/m. If this is out of the aformentioned range listed, please consider follow up with your Primary Care Provider.   ________________________________________________________  The [facility] providers would like to encourage you to use MYCHART to communicate with providers for non-urgent requests or questions.  Due to long hold times on the telephone, sending your provider a message by [facility] may be a faster and more efficient way to get a response.  Please allow 48 business hours for a response.  Please remember that this is for non-urgent requests.  _______________________________________________________  [facility] is using a team-based approach to care.  Your team is made up of your doctor and two to three APPS. Our APPS (Nurse Practitioners and Physician Assistants) work with your physician to ensure care continuity for you. They are fully qualified to address your health concerns and develop a treatment plan. They communicate directly with your gastroenterologist to care for you. Seeing the Advanced Practice Practitioners on your physician's team can help you by facilitating care more promptly, often allowing for earlier appointments, access to diagnostic testing, procedures, and other specialty referrals.   Your provider has requested that you go to the basement level for lab work before leaving today. Press B on the  elevator. The lab is located at the first door on the left as you exit the elevator.  Due to recent changes in healthcare laws, you may see the results of your imaging and laboratory studies on MyChart before your provider has had a chance to review them.  We understand that in some cases there may be results that are confusing or concerning to you. Not all laboratory results come back in the same time frame and the provider may be waiting for multiple results in order to interpret others.  Please give us  48 hours in order for your provider to thoroughly review all the results before contacting the office for clarification of your results.   First do a trial off milk/lactose products if you use them.  Add fiber like benefiber or citracel once a day Increase activity  For IBS and peppermint oil.  Peppermint oil has been proven to be better than placebo for cramping for IBS Stop if it worsens heart burn or causes flushing of your face.  Ideally enteric coated peppermint oil capsules over the counter IBGard, can take 2 a day is best but if you got the oil, you can use 0.63ml or 180 mg of pepperment oil up to 3 x a day.   Consider talking with PCP about anti anxiety medication, consider switching to SNRI like effexor, pritiq, cymbalta.     FODMAP stands for fermentable oligo-, di-, mono-saccharides and polyols (1). These are the scientific terms used to classify groups of carbs that are difficult for our body to digest and that are notorious for triggering digestive symptoms like bloating, gas, loose stools and stomach pain.   You can try low FODMAP diet  - start with eliminating just one column at  a time that you feel may be a trigger for you. - the table at the very bottom contains foods that are low in FODMAPs   Sometimes trying to eliminate the FODMAP's from your diet is difficult or tricky, if you are stuggling with trying to do the elimination diet you can try an enzyme.  There is a food  enzymes that you sprinkle in or on your food that helps break down the FODMAP. You can read more about the enzyme by going to this site: https://fodzyme.com/   Here some information about pelvic floor dysfunction. This may be contributing to some of your symptoms. We will continue with our evaluation but I do want you to consider adding on fiber supplement with low-dose MiraLAX daily. We could also refer to pelvic floor physical therapy.   Pelvic Floor Dysfunction, Female Pelvic floor dysfunction (PFD) is a condition that results when the group of muscles and connective tissues that support the organs in the pelvis (pelvic floor muscles) do not work well. These muscles and their connections form a sling that supports the colon and bladder. In women, they also support the uterus. PFD causes pelvic floor muscles to be too weak, too tight, or both. In PFD, muscle movements are not coordinated. This may cause bowel or bladder problems. It may also cause pain. What are the causes? This condition may be caused by an injury to the pelvic area or by a weakening of pelvic muscles. This often results from pregnancy and childbirth or other types of strain. In many cases, the exact cause is not known. What increases the risk? The following factors may make you more likely to develop this condition: Having chronic bladder tissue inflammation (interstitial cystitis). Being an older person. Being overweight. History of radiation treatment for cancer in the pelvic region. Previous pelvic surgery, such as removal of the uterus (hysterectomy). What are the signs or symptoms? Symptoms of this condition vary and may include: Bladder symptoms, such as: Trouble starting urination and emptying the bladder. Frequent urinary tract infections. Leaking urine when coughing, laughing, or exercising (stress incontinence). Having to pass urine urgently or frequently. Pain when passing urine. Bowel symptoms, such  as: Constipation. Urgent or frequent bowel movements. Incomplete bowel movements. Painful bowel movements. Leaking stool or gas. Unexplained genital or rectal pain. Genital or rectal muscle spasms. Low back pain. Other symptoms may include: A heavy, full, or aching feeling in the vagina. A bulge that protrudes into the vagina. Pain during or after sex. How is this diagnosed? This condition may be diagnosed based on: Your symptoms and medical history. A physical exam. During the exam, your health care provider may check your pelvic muscles for tightness, spasm, pain, or weakness. This may include a rectal exam and a pelvic exam. In some cases, you may have diagnostic tests, such as: Electrical muscle function tests. Urine flow testing. X-ray tests of bowel function. Ultrasound of the pelvic organs. How is this treated? Treatment for this condition depends on the symptoms. Treatment options include: Physical therapy. This may include Kegel exercises to help relax or strengthen the pelvic floor muscles. Biofeedback. This type of therapy provides feedback on how tight your pelvic floor muscles are so that you can learn to control them. Internal or external massage therapy. A treatment that involves electrical stimulation of the pelvic floor muscles to help control pain (transcutaneous electrical nerve stimulation, or TENS). Sound wave therapy (ultrasound) to reduce muscle spasms. Medicines, such as: Muscle relaxants. Bladder control medicines. Surgery  to reconstruct or support pelvic floor muscles may be an option if other treatments do not help. Follow these instructions at home: Activity Do your usual activities as told by your health care provider. Ask your health care provider if you should modify any activities. Do pelvic floor strengthening or relaxing exercises at home as told by your physical therapist. Lifestyle Maintain a healthy weight. Eat foods that are high in fiber,  such as beans, whole grains, and fresh fruits and vegetables. Limit foods that are high in fat and processed sugars, such as fried or sweet foods. Manage stress with relaxation techniques such as yoga or meditation. General instructions If you have problems with leakage: Use absorbable pads or wear padded underwear. Wash frequently with mild soap. Keep your genital and anal area as clean and dry as possible. Ask your health care provider if you should try a barrier cream to prevent skin irritation. Take warm baths to relieve pelvic muscle tension or spasms. Take over-the-counter and prescription medicines only as told by your health care provider. Keep all follow-up visits. How is this prevented? The cause of PFD is not always known, but there are a few things you can do to reduce the risk of developing this condition, including: Staying at a healthy weight. Getting regular exercise. Managing stress. Contact a health care provider if: Your symptoms are not improving with home care. You have signs or symptoms of PFD that get worse at home. You develop new signs or symptoms. You have signs of a urinary tract infection, such as: Fever. Chills. Increased urinary frequency. A burning feeling when urinating. You have not had a bowel movement in 3 days (constipation). Summary Pelvic floor dysfunction results when the muscles and connective tissues in your pelvic floor do not work well. These muscles and their connections form a sling that supports your colon and bladder. In women, they also support the uterus. PFD may be caused by an injury to the pelvic area or by a weakening of pelvic muscles. PFD causes pelvic floor muscles to be too weak, too tight, or a combination of both. Symptoms may vary from person to person. In most cases, PFD can be treated with physical therapies and medicines. Surgery may be an option if other treatments do not help. This information is not intended to replace  advice given to you by your health care provider. Make sure you discuss any questions you have with your health care provider. Document Revised: 08/31/2020 Document Reviewed: 08/31/2020 [employer] Patient Education  2022 [employer]  Thank you for entrusting me with your care and choosing [facility].  Alan Coombs, PA-C

## 2024-02-03 ENCOUNTER — Other Ambulatory Visit: Payer: Self-pay | Admitting: Medical Genetics

## 2024-02-04 ENCOUNTER — Other Ambulatory Visit (HOSPITAL_COMMUNITY)
Admission: RE | Admit: 2024-02-04 | Discharge: 2024-02-04 | Disposition: A | Discharge status: Home / Self Care | Payer: Self-pay | Source: Ambulatory Visit | Attending: Medical Genetics | Admitting: Medical Genetics

## 2024-02-04 LAB — CBC WITH DIFFERENTIAL/PLATELET
Absolute Lymphocytes: 1445 {cells}/uL (ref 850–3900)
Absolute Monocytes: 210 {cells}/uL (ref 200–950)
Basophils Absolute: 21 {cells}/uL (ref 0–200)
Basophils Relative: 0.5 %
Eosinophils Absolute: 80 {cells}/uL (ref 15–500)
Eosinophils Relative: 1.9 %
HCT: 38.2 % (ref 35.0–45.0)
Hemoglobin: 12.3 g/dL (ref 11.7–15.5)
MCH: 32.5 pg (ref 27.0–33.0)
MCHC: 32.2 g/dL (ref 32.0–36.0)
MCV: 100.8 fL — ABNORMAL HIGH (ref 80.0–100.0)
MPV: 11.4 fL (ref 7.5–12.5)
Monocytes Relative: 5 %
Neutro Abs: 2444 {cells}/uL (ref 1500–7800)
Neutrophils Relative %: 58.2 %
Platelets: 189 Thousand/uL (ref 140–400)
RBC: 3.79 Million/uL — ABNORMAL LOW (ref 3.80–5.10)
RDW: 12.6 % (ref 11.0–15.0)
Total Lymphocyte: 34.4 %
WBC: 4.2 Thousand/uL (ref 3.8–10.8)

## 2024-02-04 LAB — COMPREHENSIVE METABOLIC PANEL WITH GFR
AG Ratio: 1.7 (calc) (ref 1.0–2.5)
ALT: 28 U/L (ref 6–29)
AST: 24 U/L (ref 10–35)
Albumin: 4.7 g/dL (ref 3.6–5.1)
Alkaline phosphatase (APISO): 40 U/L (ref 37–153)
BUN/Creatinine Ratio: 27 (calc) — ABNORMAL HIGH (ref 6–22)
BUN: 27 mg/dL — ABNORMAL HIGH (ref 7–25)
CO2: 25 mmol/L (ref 20–32)
Calcium: 9.8 mg/dL (ref 8.6–10.4)
Chloride: 104 mmol/L (ref 98–110)
Creat: 1 mg/dL — ABNORMAL HIGH (ref 0.60–0.95)
Globulin: 2.8 g/dL (ref 1.9–3.7)
Glucose, Bld: 94 mg/dL (ref 65–99)
Potassium: 4.2 mmol/L (ref 3.5–5.3)
Sodium: 138 mmol/L (ref 135–146)
Total Bilirubin: 0.8 mg/dL (ref 0.2–1.2)
Total Protein: 7.5 g/dL (ref 6.1–8.1)
eGFR: 56 mL/min/1.73m2 — ABNORMAL LOW (ref >=60)

## 2024-02-04 LAB — IRON,TIBC AND FERRITIN PANEL
%SAT: 24 % (ref 16–45)
Ferritin: 27 ng/mL (ref 16–288)
Iron: 92 ug/dL (ref 45–160)
TIBC: 390 ug/dL (ref 250–450)

## 2024-02-04 LAB — CORTISOL: Cortisol, Plasma: 10.7 ug/dL

## 2024-02-04 LAB — RETICULOCYTES
ABS Retic: 56850 {cells}/uL (ref 20000–80000)
Retic Ct Pct: 1.5 %

## 2024-02-04 LAB — FOLATE: Folate: >24 ng/mL

## 2024-02-04 LAB — VITAMIN B12: Vitamin B-12: 622 pg/mL (ref 200–1100)

## 2024-02-05 ENCOUNTER — Ambulatory Visit: Payer: Self-pay | Admitting: Physician Assistant

## 2024-02-05 DIAGNOSIS — D649 Anemia, unspecified: Secondary | ICD-10-CM

## 2024-02-05 DIAGNOSIS — D509 Iron deficiency anemia, unspecified: Secondary | ICD-10-CM

## 2024-02-13 ENCOUNTER — Other Ambulatory Visit

## 2024-02-13 DIAGNOSIS — D649 Anemia, unspecified: Secondary | ICD-10-CM | POA: Diagnosis not present

## 2024-02-13 DIAGNOSIS — D509 Iron deficiency anemia, unspecified: Secondary | ICD-10-CM

## 2024-02-14 ENCOUNTER — Ambulatory Visit: Payer: Self-pay | Admitting: Physician Assistant

## 2024-02-14 LAB — GENECONNECT MOLECULAR SCREEN: Genetic Analysis Overall Interpretation: NEGATIVE

## 2024-02-14 LAB — HEMOCCULT SLIDES (X 3 CARDS)
Fecal Occult Blood: NEGATIVE
OCCULT 1: NEGATIVE
OCCULT 2: NEGATIVE
OCCULT 3: NEGATIVE
OCCULT 4: NEGATIVE
OCCULT 5: NEGATIVE

## 2024-04-11 ENCOUNTER — Other Ambulatory Visit: Payer: Self-pay | Admitting: Cardiology

## 2024-04-11 DIAGNOSIS — E782 Mixed hyperlipidemia: Secondary | ICD-10-CM

## 2024-04-12 ENCOUNTER — Other Ambulatory Visit: Payer: Self-pay | Admitting: Gastroenterology

## 2024-04-12 DIAGNOSIS — K529 Noninfective gastroenteritis and colitis, unspecified: Secondary | ICD-10-CM
